# Patient Record
Sex: Female | Born: 1970 | Race: White | Hispanic: No
Health system: Southern US, Community
[De-identification: ages and names within clinical notes are randomized; demographics above are authoritative.]

## PROBLEM LIST (undated history)

## (undated) DIAGNOSIS — G43909 Migraine, unspecified, not intractable, without status migrainosus: Secondary | ICD-10-CM

## (undated) DIAGNOSIS — K219 Gastro-esophageal reflux disease without esophagitis: Secondary | ICD-10-CM

## (undated) DIAGNOSIS — F419 Anxiety disorder, unspecified: Secondary | ICD-10-CM

## (undated) DIAGNOSIS — K5090 Crohn's disease, unspecified, without complications: Secondary | ICD-10-CM

## (undated) HISTORY — DX: Gastro-esophageal reflux disease without esophagitis: K21.9

## (undated) HISTORY — PX: VAGINA SURGERY: SHX829

## (undated) HISTORY — PX: COLOSTOMY: SHX63

## (undated) HISTORY — DX: Crohn's disease, unspecified, without complications: K50.90

## (undated) HISTORY — PX: COLON SURGERY: SHX602

---

## 1997-10-29 ENCOUNTER — Other Ambulatory Visit: Admission: RE | Admit: 1997-10-29 | Discharge: 1997-10-29 | Payer: Self-pay | Admitting: Obstetrics and Gynecology

## 2001-09-07 ENCOUNTER — Emergency Department (HOSPITAL_COMMUNITY): Admission: EM | Admit: 2001-09-07 | Discharge: 2001-09-07 | Payer: Self-pay | Admitting: Emergency Medicine

## 2002-01-25 ENCOUNTER — Ambulatory Visit (HOSPITAL_COMMUNITY): Admission: RE | Admit: 2002-01-25 | Discharge: 2002-01-25 | Payer: Self-pay | Admitting: General Surgery

## 2002-01-25 ENCOUNTER — Encounter (INDEPENDENT_AMBULATORY_CARE_PROVIDER_SITE_OTHER): Payer: Self-pay | Admitting: Specialist

## 2002-02-17 ENCOUNTER — Encounter: Payer: Self-pay | Admitting: Emergency Medicine

## 2002-02-18 ENCOUNTER — Encounter: Payer: Self-pay | Admitting: Emergency Medicine

## 2002-02-18 ENCOUNTER — Inpatient Hospital Stay (HOSPITAL_COMMUNITY): Admission: EM | Admit: 2002-02-18 | Discharge: 2002-02-21 | Payer: Self-pay | Admitting: Emergency Medicine

## 2002-02-18 ENCOUNTER — Encounter: Payer: Self-pay | Admitting: Family Medicine

## 2002-02-20 ENCOUNTER — Encounter: Payer: Self-pay | Admitting: General Surgery

## 2002-02-21 ENCOUNTER — Encounter: Payer: Self-pay | Admitting: General Surgery

## 2004-01-23 ENCOUNTER — Ambulatory Visit (HOSPITAL_COMMUNITY): Admission: RE | Admit: 2004-01-23 | Discharge: 2004-01-23 | Payer: Self-pay | Admitting: General Surgery

## 2004-06-14 ENCOUNTER — Other Ambulatory Visit: Admission: RE | Admit: 2004-06-14 | Discharge: 2004-06-14 | Payer: Self-pay | Admitting: Obstetrics and Gynecology

## 2005-03-29 ENCOUNTER — Ambulatory Visit: Admission: RE | Admit: 2005-03-29 | Discharge: 2005-03-29 | Payer: Self-pay | Admitting: Gynecology

## 2005-04-15 ENCOUNTER — Ambulatory Visit (HOSPITAL_COMMUNITY): Admission: RE | Admit: 2005-04-15 | Discharge: 2005-04-15 | Payer: Self-pay | Admitting: Gynecology

## 2005-05-24 ENCOUNTER — Ambulatory Visit (HOSPITAL_COMMUNITY): Admission: RE | Admit: 2005-05-24 | Discharge: 2005-05-24 | Payer: Self-pay | Admitting: Gynecology

## 2005-05-24 ENCOUNTER — Encounter (INDEPENDENT_AMBULATORY_CARE_PROVIDER_SITE_OTHER): Payer: Self-pay | Admitting: Specialist

## 2005-05-29 ENCOUNTER — Inpatient Hospital Stay (HOSPITAL_COMMUNITY): Admission: AD | Admit: 2005-05-29 | Discharge: 2005-05-29 | Payer: Self-pay | Admitting: Obstetrics and Gynecology

## 2005-06-03 ENCOUNTER — Ambulatory Visit: Admission: RE | Admit: 2005-06-03 | Discharge: 2005-06-03 | Payer: Self-pay | Admitting: Gynecology

## 2005-06-16 ENCOUNTER — Inpatient Hospital Stay (HOSPITAL_COMMUNITY): Admission: AD | Admit: 2005-06-16 | Discharge: 2005-06-16 | Payer: Self-pay | Admitting: Obstetrics and Gynecology

## 2005-06-22 ENCOUNTER — Ambulatory Visit: Admission: RE | Admit: 2005-06-22 | Discharge: 2005-06-22 | Payer: Self-pay | Admitting: Gynecology

## 2005-07-13 ENCOUNTER — Ambulatory Visit: Admission: RE | Admit: 2005-07-13 | Discharge: 2005-07-13 | Payer: Self-pay | Admitting: Gynecology

## 2005-08-02 ENCOUNTER — Ambulatory Visit: Admission: RE | Admit: 2005-08-02 | Discharge: 2005-08-02 | Payer: Self-pay | Admitting: Gynecology

## 2005-08-30 ENCOUNTER — Ambulatory Visit: Admission: RE | Admit: 2005-08-30 | Discharge: 2005-08-30 | Payer: Self-pay | Admitting: Gynecology

## 2005-08-31 ENCOUNTER — Encounter: Admission: RE | Admit: 2005-08-31 | Discharge: 2005-08-31 | Payer: Self-pay | Admitting: Gastroenterology

## 2005-09-08 ENCOUNTER — Encounter: Admission: RE | Admit: 2005-09-08 | Discharge: 2005-09-08 | Payer: Self-pay | Admitting: Gastroenterology

## 2005-09-13 ENCOUNTER — Encounter: Admission: RE | Admit: 2005-09-13 | Discharge: 2005-09-13 | Payer: Self-pay | Admitting: Gastroenterology

## 2005-09-30 ENCOUNTER — Ambulatory Visit: Admission: RE | Admit: 2005-09-30 | Discharge: 2005-09-30 | Payer: Self-pay | Admitting: Gynecology

## 2005-12-28 ENCOUNTER — Ambulatory Visit: Admission: RE | Admit: 2005-12-28 | Discharge: 2005-12-28 | Payer: Self-pay | Admitting: Gynecology

## 2006-02-07 ENCOUNTER — Ambulatory Visit: Admission: RE | Admit: 2006-02-07 | Discharge: 2006-02-07 | Payer: Self-pay | Admitting: Gynecology

## 2006-03-28 ENCOUNTER — Encounter: Admission: RE | Admit: 2006-03-28 | Discharge: 2006-03-28 | Payer: Self-pay | Admitting: Gastroenterology

## 2006-09-01 ENCOUNTER — Ambulatory Visit: Admission: RE | Admit: 2006-09-01 | Discharge: 2006-09-01 | Payer: Self-pay | Admitting: Gynecology

## 2007-07-16 ENCOUNTER — Ambulatory Visit (HOSPITAL_COMMUNITY): Admission: RE | Admit: 2007-07-16 | Discharge: 2007-07-16 | Payer: Self-pay | Admitting: Otolaryngology

## 2008-12-17 ENCOUNTER — Ambulatory Visit: Admission: RE | Admit: 2008-12-17 | Discharge: 2008-12-17 | Payer: Self-pay | Admitting: Gynecology

## 2009-01-30 ENCOUNTER — Ambulatory Visit: Admission: RE | Admit: 2009-01-30 | Discharge: 2009-01-30 | Payer: Self-pay | Admitting: Gynecology

## 2009-10-20 ENCOUNTER — Encounter: Admission: RE | Admit: 2009-10-20 | Discharge: 2009-10-20 | Payer: Self-pay | Admitting: Gastroenterology

## 2010-07-25 ENCOUNTER — Encounter: Payer: Self-pay | Admitting: Gastroenterology

## 2010-08-05 ENCOUNTER — Other Ambulatory Visit: Payer: Self-pay | Admitting: Neurology

## 2010-08-05 DIAGNOSIS — R292 Abnormal reflex: Secondary | ICD-10-CM

## 2010-08-05 DIAGNOSIS — R519 Headache, unspecified: Secondary | ICD-10-CM

## 2010-08-05 DIAGNOSIS — R200 Anesthesia of skin: Secondary | ICD-10-CM

## 2010-08-11 ENCOUNTER — Ambulatory Visit
Admission: RE | Admit: 2010-08-11 | Discharge: 2010-08-11 | Disposition: A | Discharge status: Home / Self Care | Payer: 59 | Source: Ambulatory Visit | Attending: Neurology | Admitting: Neurology

## 2010-08-11 DIAGNOSIS — R200 Anesthesia of skin: Secondary | ICD-10-CM

## 2010-08-11 DIAGNOSIS — R292 Abnormal reflex: Secondary | ICD-10-CM

## 2010-08-11 DIAGNOSIS — R519 Headache, unspecified: Secondary | ICD-10-CM

## 2010-09-08 ENCOUNTER — Other Ambulatory Visit: Payer: Self-pay | Admitting: Gastroenterology

## 2010-09-08 ENCOUNTER — Ambulatory Visit
Admission: RE | Admit: 2010-09-08 | Discharge: 2010-09-08 | Disposition: A | Discharge status: Home / Self Care | Payer: 59 | Source: Ambulatory Visit | Attending: Gastroenterology | Admitting: Gastroenterology

## 2010-09-08 DIAGNOSIS — R509 Fever, unspecified: Secondary | ICD-10-CM

## 2010-09-08 DIAGNOSIS — R059 Cough, unspecified: Secondary | ICD-10-CM

## 2010-09-08 DIAGNOSIS — R05 Cough: Secondary | ICD-10-CM

## 2010-09-09 ENCOUNTER — Other Ambulatory Visit: Payer: Self-pay | Admitting: Gastroenterology

## 2010-09-16 ENCOUNTER — Ambulatory Visit
Admission: RE | Admit: 2010-09-16 | Discharge: 2010-09-16 | Disposition: A | Discharge status: Home / Self Care | Payer: 59 | Source: Ambulatory Visit | Attending: Gastroenterology | Admitting: Gastroenterology

## 2010-09-16 ENCOUNTER — Other Ambulatory Visit: Payer: 59

## 2010-09-16 ENCOUNTER — Other Ambulatory Visit: Payer: Self-pay | Admitting: Gastroenterology

## 2010-10-19 ENCOUNTER — Ambulatory Visit (HOSPITAL_COMMUNITY)
Admission: RE | Admit: 2010-10-19 | Discharge: 2010-10-19 | Disposition: A | Discharge status: Home / Self Care | Payer: 59 | Source: Ambulatory Visit | Attending: Gastroenterology | Admitting: Gastroenterology

## 2010-10-19 DIAGNOSIS — R109 Unspecified abdominal pain: Secondary | ICD-10-CM | POA: Insufficient documentation

## 2010-10-19 DIAGNOSIS — K5090 Crohn's disease, unspecified, without complications: Secondary | ICD-10-CM | POA: Insufficient documentation

## 2010-10-19 DIAGNOSIS — K449 Diaphragmatic hernia without obstruction or gangrene: Secondary | ICD-10-CM | POA: Insufficient documentation

## 2010-10-19 DIAGNOSIS — K219 Gastro-esophageal reflux disease without esophagitis: Secondary | ICD-10-CM | POA: Insufficient documentation

## 2010-10-24 NOTE — Op Note (Signed)
  NAME:  Stephanie, [name]               ACCOUNT NO.:  [id number]  MEDICAL RECORD NO.:  [id number]           PATIENT TYPE:  O  LOCATION:  WLEN                         FACILITY:  [facility]  PHYSICIAN:  Petra Kuba, M.D.    DATE OF BIRTH:  March 30, 1971  DATE OF PROCEDURE:  10/19/2010 DATE OF DISCHARGE:                              OPERATIVE REPORT   PROCEDURE:  Ileoscopy.  INDICATION FOR PROCEDURE:  The patient with history of Crohn's disease with multiple medical symptoms including abdominal pain and wants to rule out active Crohn's.  Consent was signed after the risks, benefits, methods, and options were thoroughly discussed multiple times in the past.  ADDITIONAL MEDICINES FOR THIS PROCEDURE:  80 mg of propofol only.  PROCEDURE IN DETAIL:  The endoscope was inserted via the ostomy and in the two man procedure with the nurse assisting, we were able to advance to 40 cm where unfortunately a tight turn was seen at that juncture and we could not maneuver the upper endoscope around the that curve.  On insertion, no signs of Crohn's or abnormalities were seen.  The scope was slowly withdrawn again, confirming a normal terminal ileum.  The scope was removed.  The patient tolerated the procedure well.  There was no obvious immediate complication.  ENDOSCOPIC DIAGNOSIS:  Normal ileoscopy to 40 centimeters.  PLAN:  We will see her back in a few weeks to decide any further workup or decide whether we should try either prednisone or Humira with all of the risks being discussed multiple times.  She will call me sooner p.r.n.          ______________________________ Petra Kuba, M.D.     MEM/MEDQ  D:  10/19/2010  T:  10/19/2010  Job:  [id number]  cc:   Petra Kuba, M.D. Fax: [phone]  Electronically Signed by Vida Rigger M.D. on 10/24/2010 11:22:00 AM

## 2010-10-24 NOTE — Op Note (Signed)
  NAME:  Stephanie [name], Stephanie [name]               ACCOUNT NO.:  [id number]  MEDICAL RECORD NO.:  [id number]           PATIENT TYPE:  O  LOCATION:  WLEN                         FACILITY:  [facility]  PHYSICIAN:  Petra Kuba, M.D.    DATE OF BIRTH:  09-Jan-1971  DATE OF PROCEDURE:  10/19/2010 DATE OF DISCHARGE:                              OPERATIVE REPORT   PROCEDURE:  Esophagogastroduodenoscopy.  INDICATION:  Abdominal pain, upper tract symptoms.  Consent was signed after risks, benefits, methods, options thoroughly discussed multiple times in the past.  MEDICINES USED PER ANESTHESIA: 1. Propofol 180 mg. 2. Versed 2.5 mg. 3. Fentanyl 50 mcg.  PROCEDURE DETAILS:  The video endoscope was inserted by direct vision. Quick look at the vocal cords were normal.  The esophagus was normal. She did have a tiny to small hiatal hernia.  Scope passed into the stomach, advanced through a normal antrum, normal pylorus into a normal duodenal bulb and around the C-loop to a normal second portion of the duodenum.  Unfortunately due to looping, we could not advance any further, so the scope was slowly withdrawn back to the bulb and again a good look was normal.  Scope was withdrawn back to stomach and retroflexed.  Angularis, cardia, fundus, lesser and greater curve were normal on retroflexed visualization.  Straight visualization of the stomach did not reveal any additional findings.  Air was suctioned, scope removed.  Again, the tiny small hiatal hernia was confirmed.  The esophagus was normal.  Scope was removed.  The patient tolerated the procedure well.  There was no obvious immediate complication.  ENDOSCOPIC DIAGNOSES: 1. Tiny to small hiatal hernia. 2. Otherwise, normal EGD.  PLAN:  Continue workup with a ileoscopy.  Continue pump inhibitors or acid blockers with further workup and plans pending findings below.          ______________________________ Petra Kuba, M.D.     MEM/MEDQ  D:   10/19/2010  T:  10/19/2010  Job:  [id number]  Electronically Signed by Vida Rigger M.D. on 10/24/2010 11:22:02 AM

## 2010-11-16 NOTE — Consult Note (Signed)
NAME:  Stephanie [name], Stephanie [name] NO.:  [id number]   MEDICAL RECORD NO.:  [id number]          PATIENT TYPE:  OUT   LOCATION:  GYN                          FACILITY:  [facility]   PHYSICIAN:  De Blanch, M.D.DATE OF BIRTH:  August 24, 1970   DATE OF CONSULTATION:  12/17/2008  DATE OF DISCHARGE:                                 CONSULTATION   CHIEF COMPLAINT:  Vaginal discharge.   HISTORY OF PRESENT ILLNESS:  The patient was last seen in February 2008.  Prior to that, she had undergone extensive resection of multiple sinus  tracts in the perineum for persistent Crohn's disease.  At that time,  she was doing well and had fully recovered.  However in more recent  months, she has developed increasing vaginal discharge requiring her to  wear a mini-pad.  She denies any bleeding.  She has a chronic low-grade  temperature of 101, but does not have any significant pain in the  perineum or vagina.  She remains under the care of Dr. Ewing Schlein with her  chronic Crohn's disease.  Currently, she is not taking any medication  treating the Crohn's disease.   PAST MEDICAL HISTORY/ILLNESSES:  Crohn's disease, undergoing multiple  surgical abdominal procedures, total colectomy and ileostomy.  We  excised multiple chronic fistulous tracts in November 2006.  The area  was left open to granulate and ultimately developed squamous epithelium  across the perineum and lower vagina.  The patient's pain is managed by  Dr. Thyra Breed.   SOCIAL HISTORY:  The patient is single.  She is currently unemployed.   FAMILY HISTORY:  Negative for gynecologic, breast or colon cancer.   OBSTETRICAL HISTORY:  Gravida 0.   GYNECOLOGIC HISTORY:  The patient has no abnormal Pap smears.  She has  regular cyclic menstrual periods and no intermenstrual spotting   REVIEW OF SYSTEMS:  A 10-point comprehensive review of systems negative  except as noted above.   PHYSICAL EXAMINATION:  VITAL SIGNS:  Weight 132  pounds.  GENERAL:  The patient is a healthy white female in no acute distress.  HEENT:  Negative.  NECK:  Supple without thyromegaly.  There is no supraclavicular or  inguinal adenopathy.  ABDOMEN:  Soft, nontender.  PELVIC:  EGBUS is essentially normal except for the more open perineum.  However inspection just beyond the perineum, reveals very thin squamous  epithelium and an area of velvety granulation tissue.  There is no  evidence of any fistulas, fissures or sinus tracts.  There is a band of  the posterior vagina about midway and then above this is the cervix  which appears normal.   IMPRESSION:  Chronic granulation tissue in the perineum.  This is the  area that granulated in over a period of time after her excision in  2006.  I believe the granulation tissue is the source of her discharge  as I do not feel any abscesses or sinus tracts.  Clearly this is a  difficult problem and replacing this skin would be difficult by  grafting.  I  would like to attempt to treat this with MetroGel  every other night  alternating with Premarin cream every other night.   FOLLOW UP:  We will plan on seeing the patient back again in 1 month for  reevaluation.      De Blanch, M.D.  Electronically Signed     DC/MEDQ  D:  12/17/2008  T:  12/17/2008  Job:  [id number]   cc:   Petra Kuba, M.D.  Fax: [phone]   Mark L. Vear Clock, M.D.  Fax: [phone]   Carrington Clamp, M.D.  Fax: [phone]   Milagros Evener, M.D.  Fax: [phone]   W. Danella Maiers, M.D.  Fax: [phone]   Telford Nab, R.N.  [address]  [place], [place] [address]

## 2010-11-16 NOTE — Consult Note (Signed)
NAME:  Stephanie [name], Stephanie [name]               ACCOUNT NO.:  [id number]   MEDICAL RECORD NO.:  [id number]          PATIENT TYPE:  OUT   LOCATION:  GYN                          [facility]ACILITY:  [facility]   PHYSICIAN:  De Blanch, M.D.DATE OF BIRTH:  December 26, 1970   DATE OF CONSULTATION:  01/30/2009  DATE OF DISCHARGE:                                 CONSULTATION   The patient returns today for reevaluation having been given a regimen  to assist with her vaginal discharge.  She is using MetroGel alternating  with Premarin cream every other night.  She reports that overall during  the past month, she has somewhat improved.  She does not wish to be  examined today.   She is concerned about what appears to be a 15 pound weight gain over  the past month and a half.  She has no explanation for this weight gain.  We have weighed her on several scales and they are consistent in  documenting that her current weight is 147 pounds.   IMPRESSION:  Chronic vaginitis.  We will continue the regimen outlined  above and the patient will call us in 1 month to bring [place] up to date  with how she is doing.  I will plan on seeing the patient again in  approximately 2 months.   Face-to-face consultation time today was approximately 10 minutes.      De Blanch, M.D.  Electronically Signed     DC/MEDQ  D:  01/30/2009  T:  01/30/2009  Job:  [id number]   cc:   Telford Nab, R.N.  [address]  [place], [place] [address]

## 2010-11-19 NOTE — Consult Note (Signed)
NAME:  Stephanie [name], Stephanie [name] NO.:  [id number]   MEDICAL RECORD NO.:  [id number]          PATIENT TYPE:  OUT   LOCATION:  GYN                          FACILITY:  [facility]   PHYSICIAN:  De Blanch, M.D.DATE OF BIRTH:  [birth year]   DATE OF CONSULTATION:  12/28/2005  DATE OF DISCHARGE:                                   CONSULTATION   CHIEF COMPLAINT:  Vaginal drainage.   HISTORY OF PRESENT ILLNESS:  The patient returns today for continuing  followup, having undergone extensive excision of her perineum for fistulae  associated with Crohn's disease.  Her recovery has been steady.  Today, she  presents, indicated she is doing quite well.  She is using her dilator less  often.  She does continue to have some drainage requiring a mini-pad.  She  is not have any bleeding.  She has not resumed having sexual intercourse.   Recently, she had been working with Dr. Ewing Schlein because of new onset of  nausea, vomiting and headaches.  She thinks is associated with a prior use  of Augmentin, which resulted in diarrhea.   Past medical history, current medications, social history, family history  and obstetrical history are reviewed and are unchanged from my note of  August 30, 2005.   REVIEW OF SYSTEMS:  Ten-point comprehensive review of systems is negative  except as noted above.   PHYSICAL EXAM:  VITAL SIGNS:  Weight 103 pounds, blood pressure 110/70,  pulse 60, respiratory rate 20.  GENERAL:  The patient is a healthy, slender white female in no acute  distress.  HEENT:  Negative.  NECK:  Supple without thyromegaly.  ABDOMEN:  Soft and nontender.  No mass, organomegaly, ascites or hernias are  noted.  Her stoma is healthy.  PELVIC:  EG/BUS are normal.  The posterior  perineum has epithelialized up to a very small, thin area of granulation  tissue which joins the vagina.  The introitus is patent, as is the vagina.  No other abnormalities are noted.   DESCRIPTION OF  PROCEDURE:  Silver nitrate is applied to the area of  granulation tissue along with some Hurricaine gel.  The patient tolerated  the procedure well.  I am hopeful that the application of silver nitrate  will result in resolution of the remaining granulation tissue.   FOLLOWUP:  The patient will return to see me in approximately 1 month for  reevaluation and further use of silver nitrate may be necessary at that  time.   ASSESSMENT:  Overall, the patient is quite satisfied with her results.      De Blanch, M.D.  Electronically Signed     DC/MEDQ  D:  12/28/2005  T:  12/28/2005  Job:  [id number]   cc:   Carrington Clamp, M.D.  Fax: [phone]   Petra Kuba, M.D.  Fax: [phone]   Telford Nab, R.N.  [address]  [place], [place] [address]

## 2010-11-19 NOTE — Op Note (Signed)
NAME:  [name], [name]                         ACCOUNT NO.:  [id number]   MEDICAL RECORD NO.:  [id number]                   PATIENT TYPE:  AMB   LOCATION:  DAY                                  FACILITY:  [facility]   PHYSICIAN:  Timothy E. Earlene Plater, M.D.              DATE OF BIRTH:  [birth year]   DATE OF PROCEDURE:  DATE OF DISCHARGE:                                 OPERATIVE REPORT   PREOPERATIVE DIAGNOSIS:  Presence of Port-A-Cath.   POSTOPERATIVE DIAGNOSIS:  Presence of Port-A-Cath.   PROCEDURE:  Removal of Port-A-Cath.   SURGEON:  Timothy E. Earlene Plater, M.D.   ANESTHESIA:  Local with standby.   Ms. Stephanie [name] is now 60.  Has multiple operations and complications for  Crohn's disease.  Is on chronic narcotics.  At [facility], a Port-A-Cath had been  installed two years ago but has not been accessed, flushed, or used in the  past year.  She has lost weight, and its presence is more prominent and more  uncomfortable.  She wants it removed.  Although she is a very difficult IV  access, she understands that removing this will remove her easy access.  She  is pleasant, informed, and has been identified and signed the permit.   She is taken to the operating room after difficult IV access peripherally.  Heavy sedation was given.  The left upper chest was prepped and draped in  the usual fashion.  Marcaine 0.25% was used for local anesthesia.  The scar  on the left upper chest was excised.  The tunnel of the Port-A-Cath was  dissected.  The catheter was grasped and removed from its tunnel to the left  subclavian area.  There was no bleeding or complication.  The port device  was then removed from its envelope.  Sutures cut and also removed.  Tiny  bleeding points were cauterized.  The wound was perfectly dry.  It was  closed in layers with 4-0 Monocryl.  Steri-Strips and Tegaderm applied.  She  tolerated it well without complications and was removed to the recovery room  in good condition.   Written and  verbal instructions given.  She will be seen and followed as  needed.                                               Timothy E. Earlene Plater, M.D.    TED/MEDQ  D:  01/23/2004  T:  01/23/2004  Job:  [id number]

## 2010-11-19 NOTE — Consult Note (Signed)
NAME:  Stephanie [name], SPROWL NO.:  [id number]   MEDICAL RECORD NO.:  [id number]          PATIENT TYPE:  OUT   LOCATION:  GYN                          FACILITY:  [facility]   PHYSICIAN:  De Blanch, M.D.DATE OF BIRTH:  [birth year]   DATE OF CONSULTATION:  09/30/2005  DATE OF DISCHARGE:  09/30/2005                                   CONSULTATION   A 40 year old white female returns for continuing followup of postoperative  care following radical resection of perineal vaginal fistulae associated  with Crohn's disease.   Since her last visit, the patient has continued to use a vaginal dilator  with good success.  She does still have some pain and tenderness in the  posterior vagina.  She is seeing Dr. Vida Rigger because of abdominal pain  and discomfort.  She has had a CT scan of the chest, x-ray which were  negative.  She reports it is felt that she probably has a panic disorder and  is scheduled to begin some psychological evaluation and counseling in the  near future.  She seems very positive about potential for improvement with  this counseling.  Her only gynecologic complaint is that of some burning and  itching of the vulva.   PHYSICAL EXAM:  Weight 105 pounds.  ABDOMEN:  Soft, nontender, no mass, organomegaly, ascites or hernia noted.  Her stoma is healthy.  PELVIC:  EG, BUS shows that there is excoriation on both labia majora.  Apparently the patient has been scratching these pruritic areas.  No gross  lesions are noted.  The remainder of the vulva continues to epithelialize  well.  There remains approximately a 1 cm area between the perineal closure  and the true vagina which is still granulating.  The vagina admits two  fingers easily.  The area of discomfort is the midline of the area that is  healing and still granulating.  The remainder of her vulva appears to have  healed nicely.  Bimanual exam reveals no significant tenderness except for  the area in  the midline posteriorly.   IMPRESSION:  The patient continues to heal well.  Will plan on seeing her  back in 4-6 weeks.  She will continue to use the dilator.  I think that if  she is not too uncomfortable, she could resume sexual intercourse in the  near future.      De Blanch, M.D.  Electronically Signed     DC/MEDQ  D:  09/30/2005  T:  10/03/2005  Job:  [id number]   cc:   Carrington Clamp, M.D.  Fax: [phone]   Telford Nab, R.N.  [address]  [place], [place] [address]   Petra Kuba, M.D.  Fax: [phone]

## 2010-11-19 NOTE — Consult Note (Signed)
NAME:  Stephanie [name], Stephanie [name]               ACCOUNT NO.:  [id number]   MEDICAL RECORD NO.:  [id number]          PATIENT TYPE:  AMB   LOCATION:  GYN                          FACILITY:  [facility]   PHYSICIAN:  De Blanch, M.D.DATE OF BIRTH:  Dec 12, 1970   DATE OF CONSULTATION:  DATE OF DISCHARGE:                                   CONSULTATION   CHIEF COMPLAINT:  Vaginal drainage.   INTERVAL HISTORY:  Since her last visit, the patient has done reasonably  well; however, over the past week, she has noted an increased amount of  vaginal drainage.  She also had some cramping and may have had a menstrual  period.  She did have bleeding, but it was a little shorter than her usual.  It is noted that her menstrual periods are fairly irregular.   Her pain medications have been adjusted.  She is currently using 8 mg of  Dilaudid every 6 hours as well as a Duragesic patch.  With the increased  vaginal discharge, she saw Dr. Henderson Cloud, who gave her a prescription for  seven days of Levaquin.  The patient denies any fevers or chills.  Says that  really, the discharge has not changed since completing the Levaquin.   HISTORY OF PRESENT ILLNESS:  The patient has a longstanding history of  Crohn's disease.  She has had multiple surgical procedures.  Ultimately, she  has had a total colectomy, including abdominal perineal resection.  She  initially was seen in September, 2006 with chronic, painful, perineal  fistula.  These were resected on May 24, 2005 and left open to  granulate and close by secondary intention.   OBSTETRICAL HISTORY:  Gravida 0.   CURRENT MEDICATIONS:  As noted above.   REVIEW OF SYSTEMS:  A 10-point comprehensive review of systems is performed  and is negative, except as noted above.   SOCIAL HISTORY:  The patient is single.  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   FAMILY HISTORY:  Negative for gynecologic, breast, or colon cancer.   PHYSICAL EXAMINATION:  ABDOMEN:   Soft and nontender.  Her ileostomy is  healthy.  PELVIC:  EG/BUS is normal.  The open perineal and lower vulvar wound is  granulating beautifully, and there is some epithelialization going on at the  margins.  There is no evidence of infection, erythema, point tenderness, or  abscess formation.  The distal vagina appears normal as well.  EXTREMITIES:  Lower extremities are without edema and varicosities.   IMPRESSION:  Patient continues to heal well.  She is to continue her current  regimen.  She is given renewed prescription for Silvadene cream.  She will  continue to pack the area loosely, irrigate, and do sitz baths, as  previously prescribed.  She will return to see me on January 9th for  continuing followup.   ADDENDUM:  In order to regulate the patient's menstrual periods, she is  given a prescription for Ortho Tri-Cyclen.      De Blanch, M.D.  Electronically Signed     DC/MEDQ  D:  06/22/2005  T:  06/23/2005  Job:  [id number]   cc:   Carrington Clamp, M.D.  Fax: [phone]   Telford Nab, R.N.  [address]  [place], [place] [address]

## 2010-11-19 NOTE — Consult Note (Signed)
NAME:  Stephanie [name], Stephanie [name]               ACCOUNT NO.:  [id number]   MEDICAL RECORD NO.:  [id number]          PATIENT TYPE:  OUT   LOCATION:  GYN                          [facility]ACILITY:  [facility]   PHYSICIAN:  De Blanch, M.D.DATE OF BIRTH:  1970-10-09   DATE OF CONSULTATION:  08/30/2005  DATE OF DISCHARGE:  08/30/2005                                   CONSULTATION   CHIEF COMPLAINT:  Vaginal drainage.   INTERVAL HISTORY:  Since her last visit, the patient has continued to do  quite well. She is using the dilator to maintain vaginal patency and has  been irrigating the vulva. Her vaginal discharge is actually quite minimal  and she really has no significant bleeding or spotting. Overall she feels  better than she has over the last several months. She has recently seen Dr.  Ewing Schlein with regard to management of her abdominal symptoms which we believe  are likely associated with her long standing Crohn's disease.   PAST MEDICAL HISTORY:  Medical illnesses - long standing Crohn's disease  undergoing multiple surgical procedures including total colectomy.   In November 2006, she underwent wide resection of a number of perineal  vaginal fistulae. The wound was left open and has been granulating well and  reepithelializing. More recently the patient was given a vaginal dilator in  order to maintain vaginal patency.   CURRENT MEDICATIONS:  Duragesic patch.   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   FAMILY HISTORY:  Negative for gynecologic breast or colon cancer.   OBSTETRICAL HISTORY:  Gravida 0.   REVIEW OF SYSTEMS:  A 10-point comprehensive review of systems is negative  except as noted above.   PHYSICAL EXAMINATION:  VITAL SIGNS:  Weight 107 pounds, blood pressure  110/70, pulse 80, respiratory rate 20.  GENERAL:  The patient is a healthy, slender, white female in no acute  distress.  HEENT:  Negative.  NECK:  Supple without  thyromegaly. There was no supraclavicular or inguinal  adenopathy.  ABDOMEN:  Soft, nontender, no mass, organomegaly, ascites or hernias are  noted. Her stoma appears healthy and there is no evidence of herniation.  PELVIC:  EGBUS shows the perineal resection continues to heal wall and is  nearly completely epithelized. She has good vaginal patency easily admitting  a speculum. There is no evidence of fistula formation.   IMPRESSION:  Status post wide resection of perineum in order to resect  perineal vaginal fistula. She is having an excellent recovery. She will  continue to use a vaginal dilator, she was given a prescription for Premarin  cream in hopes of getting better epithelialization in the vagina as well as  more pliability. She will return to see me in 1 month for continuing  followup.      De Blanch, M.D.  Electronically Signed     DC/MEDQ  D:  09/12/2005  T:  09/13/2005  Job:  [id number]   cc:   Carrington Clamp, M.D.  Fax: [phone]   Petra Kuba, M.D.  Fax: [phone]   Telford Nab,  R.N.  [address]  [place], [place] [address]

## 2010-11-19 NOTE — Consult Note (Signed)
NAME:  Stephanie [name], Stephanie [name] NO.:  [id number]   MEDICAL RECORD NO.:  [id number]          PATIENT TYPE:  OUT   LOCATION:  GYN                          FACILITY:  [facility]   PHYSICIAN:  De Blanch, M.D.DATE OF BIRTH:  12-20-1970   DATE OF CONSULTATION:  09/01/2006  DATE OF DISCHARGE:                                 CONSULTATION   CHIEF COMPLAINT:  Pelvic discomfort, abnormal bleeding.   INTERVAL HISTORY:  The patient is here today for continued followup of  her perineal resection for persistent Crohn's disease.  She has been  able to resume sexual intercourse, has not had much difficulty with that  in recent months.  She is having some abnormal bleeding and has seen Dr.  Henderson Cloud.  She had been placed on Yaz and instructed to use it  continuously.  She has number of other symptoms associated with the flu  and Crohn's disease, but with regard to her pelvic symptoms she seems to  be doing quite well.   PAST MEDICAL HISTORY:  Longstanding history of Crohn's disease,  undergoing multiple surgical procedures.  She has had a total colectomy  and ileostomy and is doing well with that.  She is under the care of Dr.  Vida Rigger with regard to her Crohn's disease at the present time.   CURRENT MEDICATIONS:  Duragesic patch, Yaz.   SOCIAL HISTORY:  The patient is single.  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  She also sees a psychiatrist on a weekly basis.   FAMILY HISTORY:  Negative for gynecologic, breast and colon cancer.   OBSTETRICAL HISTORY:  Gravida 0.   GYNECOLOGIC HISTORY:  The patient denies any abnormal Pap smears.  Please see above regarding her current gynecologic symptoms.   REVIEW OF SYSTEMS:  Ten-point comprehensive review of systems negative  except for those symptoms noted above.   HISTORY AND PHYSICAL EXAMINATION:  VITAL SIGNS:  Weight 106 pounds,  blood pressure 105/76.  GENERAL:  The patient is a healthy white female who seems  somewhat  emotionally distressed.  HEENT:  Negative.  NECK:  Supple without thyromegaly.  There is no supraclavicular or  inguinal adenopathy.  ABDOMEN:  Soft and nontender.  Her ileostomy appears healthy.  PELVIC:  EG/BUS shows that the area where we resected an extensive  portion of the perineum has completely healed, although the skin is  relatively thin across this area.  Vagina is otherwise normal as is the  cervix.  Uterus is anterior; normal shape, size, and consistency.  No  adnexal masses noted.   IMPRESSION:  Status post resection of the perineum in order to excise  multiple sinus tracts and abscess from her chronic Crohn's disease.  She  has healed very nicely at the present time and is able has sexual  intercourse.  Her introitus is not stenotic and the vagina appears  healthy.  There are some atrophic changes in the lower introitus.  I  would suggest she use Premarin cream to this area to see if we can  obtain better epithelialization.   At this juncture I believe  we have achieved maximum benefit for Ms.  [name] with regard to her perineal fistulae and abscesses, and  therefore we will release her back to the care of her gynecologist, Dr.  Carrington Clamp, and we appreciate Dr. Kittie Plater referral in  consultation.      De Blanch, M.D.  Electronically Signed     DC/MEDQ  D:  09/01/2006  T:  09/01/2006  Job:  [id number]   cc:   Carrington Clamp, M.D.  Fax: [phone]   Petra Kuba, M.D.  Fax: [phone]   Telford Nab, R.N.  [address]  [place], [place] [address]

## 2010-11-19 NOTE — Consult Note (Signed)
NAME:  Stephanie [name], Stephanie [name] NO.:  [id number]   MEDICAL RECORD NO.:  [id number]          PATIENT TYPE:  OUT   LOCATION:  GYN                          FACILITY:  [facility]   PHYSICIAN:  De Blanch, M.D.DATE OF BIRTH:  30-Oct-1970   DATE OF CONSULTATION:  DATE OF DISCHARGE:                                   CONSULTATION   CHIEF COMPLAINT:  Crohn's disease.   INTERVAL HISTORY:  Patient returns today for continuing followup and  recovery after having extensive resection of perineal fistula.  She reports  that from a pelvic point of view, she is doing well.  She denies any vaginal  bleeding.  She does have some persistent vaginal drainage, but this is only  minimal to modest, as compared to in the past when her fistulae were active.  At her last visit, we placed some silver nitrate on some granulation tissue.  The patient reports that caused severe pain.   With regard to vaginal function, the patient has not resumed having vaginal  intercourse.  She is otherwise having some difficulty with apparently a  flare of her Crohn's' disease, causing cutaneous and oral lesions.  In  addition, she has seen a counselor regarding psychological and emotional  issues that she has.   Patient reports that her husband is also in counseling because of anxiety  disorder.   PAST MEDICAL HISTORY:  Longstanding Crohn's disease, undergoing multiple  surgical procedures.  Patient has had a total colectomy and an ileostomy.   CURRENT MEDICATIONS:  Duragesic patch.   SOCIAL HISTORY:  The patient is single.  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   FAMILY HISTORY:  Negative for gynecologic, breast, or colon cancer.   OBSTETRICAL HISTORY:  Gravida 0.   GYNECOLOGIC HISTORY:  The patient denies any abnormal Pap smears.  She has  somewhat irregular periods.   REVIEW OF SYSTEMS:  A 10-point comprehensive review of systems is negative,  except as noted above.   PHYSICAL  EXAMINATION:  VITAL SIGNS:  Weight 103 pounds.  Blood pressure  100/68, pulse 70, respiratory rate 20.  GENERAL:  The patient is a healthy slender white female in no acute  distress.  HEENT:  Negative.  NECK:  Supple without thyromegaly.  There is no supraclavicular or inguinal  adenopathy.  ABDOMEN:  Soft and nontender.  No mass, organomegaly, ascites, or hernias  are noted.  Her ileostomy stoma appears healthy.  PELVIC:  EG/BUS shows that the area of the perineum which was resected has  completely healed and granulated.  She does have a dimple at the junction of  the resection and her mid posterior vagina.  There is also a ridge at this  juncture at the posterior aspect of the vaginal opening.  Anteriorly, the  vagina is normal.  The vagina is deviated posteriorly, as is the cervix.  The uterus is anterior but posterior in the pelvis.  The vagina does admit  two fingers easily.   IMPRESSION:  Status post radical resection of perineal fistula, now with  very good healing.  We had a lengthy  discussion regarding resuming sexual  intercourse.  She indicates that she and her husband are somewhat timid and  hesitant to take this step, and I suggested slowly resuming sexual  intercourse and attempting change of position as well as using digital  examination to becoming more familiar with her genital area.  She indicates  she would attempt this with her husband.  She will return to see me in three  months for continued followup.      De Blanch, M.D.  Electronically Signed     DC/MEDQ  D:  02/07/2006  T:  02/07/2006  Job:  [id number]   cc:   Petra Kuba, M.D.  Fax: [phone]   Mark L. Vear Clock, M.D.  Fax: [phone]   Milagros Evener, M.D.  Fax: [phone]   Telford Nab, R.N.  [address]  [place], [place] [address]

## 2010-11-19 NOTE — Op Note (Signed)
NAME:  ANORA, SCHWENKE               ACCOUNT NO.:  [id number]   MEDICAL RECORD NO.:  [id number]          PATIENT TYPE:  AMB   LOCATION:  DAY                          FACILITY:  [facility]   PHYSICIAN:  De Blanch, M.D.DATE OF BIRTH:  Nov 02, 1970   DATE OF PROCEDURE:  DATE OF DISCHARGE:                                 OPERATIVE REPORT   PREOPERATIVE DIAGNOSES:  1.  Crohn's disease with status post abdominal perineal resection.  2.  Chronic perineal vaginal fistulae.   POSTOPERATIVE DIAGNOSES:  1.  Crohn's disease with status post abdominal perineal resection.  2.  Chronic perineal vaginal fistulae.   PROCEDURE:  Excision of multiple perineal vaginal fistulae, debridement of  perineum and lower vagina.   SURGEON:  Dr. De Blanch   ASSISTANT:  Carrington Clamp, MD  Telford Nab, RN   ANESTHESIA:  General orotracheal tube.   ESTIMATED BLOOD LOSS:  5 mL.   COMPLICATIONS:  None.   SURGICAL FINDINGS:  Examination under anesthesia revealed perineum which  previously had had the anus removed.  There was chronic granulation in the  area where the anus previously existed, and probing this area identified 3  separate fistulae which extended between the anal dimple and the vagina.  One extended at least halfway up the posterior vaginal wall.  The perineal  body itself was extensively scarred.  Cervix and uterus are normal, and the  remainder of the vagina was normal.   PROCEDURE:  The patient is brought to the operating room and after  satisfactory attainment of general anesthesia, was placed in lithotomy  position in candy cane stirrups.  The vulva, perineum, and vagina were  prepped with Betadine, and the patient was draped.  The patient was examined  with the above-noted findings.  Using a wire probe, the perineal body was  probed and 3 separate fistulae were identified.  These were all in the  midline.  With the probe in place, an incision was made across the  perineum  in the midline down to the probe, opening up a large area of granulation  tissue within the fistula tract.  This was then excised using  electrocautery, excising a considerable amount of scarring in the perineum,  extending laterally into the ischial rectal fossa.  The third fistula was  then identified which extended farther posteriorly.  This was likewise  divided in the midline and then excised using cautery.  Additional  hemostasis achieved with cautery.  The area was irrigated, and no other  fistulae or sinus tracts  were identified.  The surgical field was then reirrigated and Silvadene  cream applied to the open wound.  This was packed loosely with Kerlix gauze.  The patient was awakened from anesthesia and taken to the recovery room in  satisfactory condition.  Sponge, needle, and instrument counts correct x2.      De Blanch, M.D.  Electronically Signed     DC/MEDQ  D:  05/24/2005  T:  05/24/2005  Job:  [id number]   cc:   Carrington Clamp, M.D.  Fax: [phone]   Telford Nab, R.N.  [address]  [place], [place] [address]

## 2010-11-19 NOTE — Op Note (Signed)
NAME:  Stephanie [name], Stephanie [name]               ACCOUNT NO.:  [id number]   MEDICAL RECORD NO.:  [id number]          PATIENT TYPE:  AMB   LOCATION:  DAY                          FACILITY:  [facility]   PHYSICIAN:  De Blanch, M.D.DATE OF BIRTH:  09-Jul-1970   DATE OF PROCEDURE:  04/15/2005  DATE OF DISCHARGE:                                 OPERATIVE REPORT   PREOPERATIVE DIAGNOSES:  Crohn's disease status post total colectomy and  proctectomy. Vulvar pain and drainage.   POSTOPERATIVE DIAGNOSIS:  Two perineal to vaginal fistulae and sinus tract.   PROCEDURE:  Examination under anesthesia.   SURGEON:  De Blanch, M.D.   ANESTHESIA:  General with orotracheal tube.   ESTIMATED BLOOD LOSS:  None.   SURGICAL FINDINGS:  Examination under anesthesia revealed a dimple where the  patient's anus had previously been. In this dimple was granulation tissue.  As we probed the dimple, two fistula were identified which extended from the  perineum through the vagina. The lower one extended in the vagina near the  introitus. The second one tracked to approximately 4 cm up the posterior  vagina. Just beneath this tract was yet another sinus tract which extended  approximately 3 cm from the skin level but did not seem to penetrate the  vagina. There was a band across the posterior vagina in the region of these  fistulae which was thickened most likely secondary to scarring from prior  surgery. The cervix was nulliparous and the remainder of the patient's exam  was normal. She did have some chronic irritation of both labia minora  unrelated to the fistula in my opinion.   Inspection of the patient's umbilicus revealed a small crack in the skin.  Apparently this been more inflamed several days ago but she been treating it  conservatively.   DESCRIPTION OF PROCEDURE:  The patient was brought to the operating room and  after satisfactory attainment of general anesthesia was placed in a modified  lithotomy position in Brooks stirrups. The perineum and vagina were prepped  with Betadine, the patient was draped. The patient was examined under  anesthesia with the above-noted  findings, wire probes were used to identify the fistula tracts. The  patient's abdomen was also inspected as noted above. The surgical procedure  was terminated and the patient was awakened from anesthesia and taken to the  recovery room in satisfactory condition. Sponge, needle and instrument  counts correct x2.      De Blanch, M.D.  Electronically Signed     DC/MEDQ  D:  04/15/2005  T:  04/15/2005  Job:  [id number]   cc:   Carrington Clamp, M.D.  Fax: [phone]   Telford Nab, R.N.  [address]  [place], [place] [address]

## 2010-11-19 NOTE — Consult Note (Signed)
NAME:  Stephanie [name], Stephanie [name]               ACCOUNT NO.:  [id number]   MEDICAL RECORD NO.:  [id number]          PATIENT TYPE:  OUT   LOCATION:  GYN                          FACILITY:  [facility]   PHYSICIAN:  De Blanch, M.D.DATE OF BIRTH:  10/27/1970   DATE OF CONSULTATION:  DATE OF DISCHARGE:                                   CONSULTATION   CHIEF COMPLAINT:  Perineal vaginal fistula, postoperative check.   Patient underwent resection of perineal vaginal fistula on May 24, 2005.  This is a relatively large resection, leaving the entire perineum and  perirectal spaces open to granulate.  The patient's initial postoperative  course was minimally complicated by some bleeding, which resolved  spontaneously.  She has had increased pain and had difficulty with pain  control.  She is chronically on a Duragesic patch.  She has seen Dr.  Carrington Clamp and apparently had a reaction to Stadol.  She has also been  restricting her fluids because voiding causes some discomfort, and she  presented to the emergency room dehydrated recently.   PHYSICAL EXAMINATION:  VITAL SIGNS:  Weight 110 pounds (stable).  ABDOMEN:  Soft and nontender.  PELVIC:  EG/BUS shows the area where the fistula was excised, is granulating  quite well.  There is no evidence of infection.  Palpation of this area  really does not elicit much pain.   IMPRESSION:  Status post resection of perineal vaginal fistula with good  granulation tissue, now less than two weeks postop.  The patient will  continue her irrigation, sitz baths, Silvadene cream, and packing.  We will  have her return to see me shortly before Christmas for further evaluation.      De Blanch, M.D.  Electronically Signed     DC/MEDQ  D:  06/03/2005  T:  06/03/2005  Job:  [id number]   cc:   Carrington Clamp, M.D.  Fax: [phone]   Telford Nab, R.N.  [address]  [place], [place] [address]

## 2010-11-19 NOTE — Consult Note (Signed)
NAME:  Stephanie [name], Stephanie [name] NO.:  [id number]   MEDICAL RECORD NO.:  [id number]          PATIENT TYPE:  OUT   LOCATION:  GYN                          FACILITY:  [facility]   PHYSICIAN:  De Blanch, M.D.DATE OF BIRTH:  [birth year]   DATE OF CONSULTATION:  DATE OF DISCHARGE:                                   CONSULTATION   CHIEF COMPLAINT:  Postoperative follow-up, fever with nausea.   INTERVAL HISTORY:  Overall the patient has been doing well with regard to  management of her perineum where the fistulae were resected. She does note  that over the year she has had a considerable amount of abdominal discomfort  with nausea and some vomiting. Her weight fell to 104 but she has gained  back to approximately 108 pounds. She denies any new abdominal pain and has  no apparent source for the fever which has been running between 99 and  101.1. Overall she has felt somewhat fatigued. She has not had any further  vaginal bleeding and the vaginal discharge is essentially absent.   HISTORY OF PRESENT ILLNESS:  The patient has a long-standing history of  Crohn's disease having undergone multiple surgical procedures including a  total colectomy. She underwent resection of perineal vaginal fistulae in  November of 2001. This area was left open to granulate.   OBSTETRICAL HISTORY:  Gravida 0.   CURRENT MEDICATIONS:  Duragesic patch.   SOCIAL HISTORY:  The patient is single. 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   FAMILY HISTORY:  Is negative for gynecologic, breast or colon cancer.   REVIEW OF SYSTEMS:  A 10-point comprehensive review of systems is negative  except for symptoms noted above.   PHYSICAL EXAMINATION:  VITAL SIGNS:  Weight 107-1/2 pound. Blood pressure  110/70, pulse 80.  GENERAL:  The patient is a healthy, slender white female in no acute  distress.  HEENT:  Is negative.  NECK:  Supple without thyromegaly. There is no supraclavicular or inguinal  adenopathy.  ABDOMEN:  Is soft, nontender. No mass, organomegaly, ascites or hernias are  noted. PELVIC EXAM: EG, BUS is normal. The perineal area is granulating  beautifully, it has a considerable amount of squamous epithelialization  especially from the outside moving inward. No evidence of infection.   IMPRESSION:  1.  Excellent continued healing following radical resection of perineal      vaginal fistulae. She will continue her      current regimen of management.  2.  Fever of uncertain etiology. This may well be a viral infection,      however, will obtain a CBC with differential for further evaluation. The      patient will return to see me in approximately 1 month for continuing      follow-up.      De Blanch, M.D.  Electronically Signed     DC/MEDQ  D:  07/13/2005  T:  07/14/2005  Job:  [id number]   cc:   Carrington Clamp, M.D.  Fax: [phone]   Telford Nab, R.N.  [address]  [place], [place] [address]

## 2010-11-19 NOTE — Consult Note (Signed)
NAME:  Stephanie [name], Stephanie [name] NO.:  [id number]   MEDICAL RECORD NO.:  [id number]          PATIENT TYPE:  OUT   LOCATION:  GYN                          FACILITY:  [facility]   PHYSICIAN:  De Blanch, M.D.DATE OF BIRTH:  [birth year]   DATE OF CONSULTATION:  03/29/2005  DATE OF DISCHARGE:                                   CONSULTATION   CHIEF COMPLAINT:  Vaginal drainage.   HISTORY OF PRESENT ILLNESS:  A 40 year old white female seen in consultation  at the request of Dr. Carrington Clamp, regarding chronic vaginal and vulvar  drainage.   The patient has a longstanding history of Crohn's disease dating back to her  freshman year of college.  She has undergone multiple surgical procedures,  ultimately undergoing a total colectomy including an abdominal perineal  resection.  Apparently following that surgical procedure, she developed what  sounds like a fistulous tract and an abscess which has been drained, but the  patient continues to have chronic drainage, some days worse than others.  She currently has an ileostomy which is permanent and functioning reasonably  well, although occasionally she gets obstructed which resolves with IV  fluids and NG suction.   In addition to vulvovaginal drainage, which is sometimes bloody but mostly  purulent and with mucus, she has considerable amount of pain and is unable  to have sexual intercourse.   From a gynecologic point of view, the patient has had regular cyclic  menstrual periods until approximately 3 months ago when she has had some  irregular periods.   OBSTETRICAL HISTORY:  Gravida 0.   CURRENT MEDICATIONS:  The patient is managed by [facility] and  is using a Duragesic patch, fentanyl lollipop p.r.n., and 5 mg of oral  oxycodone p.r.n.  She also takes multivitamins.   The patient has had an ultrasound of the pelvis recently showing endometrial  stripe of 5 mm, a normal uterus, and normal ovaries.   In August of this  because of a question of pyelonephritis, she underwent a CT urogram  revealing postoperative changes, no evidence of stones, normal kidneys, and  normal bladder.  Uterus and ovaries:  No free fluid was noted in the pelvis.  Pap smears have been normal, the most recent being in December 2005.   REVIEW OF SYSTEMS:  Otherwise negative.   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   PHYSICAL EXAMINATION:  VITAL SIGNS:  Weight 110 pounds.  GENERAL:  The patient is a pleasant white female in no acute distress.  HEENT:  Negative.  NECK:  Supple without thyromegaly.  ABDOMEN:  Soft and nontender.  She has an ileostomy in place.  Midline  incision is well-healed.  No hernias are noted.  PELVIC:  EG/BUS is essentially normal, although there is a dimple where her  anus used to be which admits a Q-tip for approximately 1 cm.  At that  juncture, it becomes too uncomfortable to proceed any further with probing.  Vaginal exam suggests an area extruding mucus or purulent material in the  right  lower vagina.  This is, again, difficult to evaluate because of  scarring across her perineal body.  Cervix appears normal.  Palpation of  this area brings out a small amount of purulent material out of the perianal  sinus.   IMPRESSION:  1.  Longstanding history of Crohn's disease which appears to be quiescent at      the present time.  2.  Chronic drainage from the vagina and vulva.  I suspect this represents      chronic abscess or sinus cavity tract.  It is difficult to fully assess      in the clinic given the patient's discomfort.  I would therefore      recommend that she undergo examination under anesthesia so we may fully      delineate her anatomy.  It may be that she will require excision of a      chronically draining sinus tract and/or opening the paravaginal space in      order to achieve full drainage and secondary closure by  granulation.      Certainly if the defect is too large, it may be closed using a      rotational flap of some sort.   We will schedule surgery for the near future.      De Blanch, M.D.  Electronically Signed     DC/MEDQ  D:  03/29/2005  T:  03/30/2005  Job:  [id number]   cc:   Telford Nab, R.N.  [address]  [place], [place] [address]   Carrington Clamp, M.D.  Fax: [phone]

## 2010-11-19 NOTE — Op Note (Signed)
[facility]  Patient:    Stephanie [name], Stephanie [name] Visit Number: [id number] MRN: [id number]          Service Type: DSU Location: DAY Attending Physician:  Carson Myrtle Dictated by:   Sheppard Plumber Earlene Plater, M.D. Proc. 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  POSTOPERATIVE DIAGNOSIS:  Persistent postoperative perineal sinus tract.  PROCEDURES:  Examination under anesthesia, debridement, and packing of perineal sinus.  SURGEON:  Timothy E. Earlene Plater, M.D.  ANESTHESIA:  General.  CLINICAL NOTE:  [name] is well known to me.  She lives and works here in [place].  She has long-standing Crohns disease.  She has been seen and cared for at [facility] for the majority of her medical care, having had a total abdominal colectomy with abdominal-perineal resection at [facility].  She follows closely with the gynecologic as well as the gastrointestinal department. 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  She has been seen and evaluated by Dr. Aleatha Borer, her gynecologist, and a persistent perineal sinus is noted.  Because it is so tender, she has been not able to be examined in the office.  Because she lives and works here and I have known her and her family for so long, she wished to proceed with me at this time for examination under anesthesia, debridement of fistula or sinus.  DESCRIPTION OF PROCEDURE:  The patient was adequately prepared, stable, and ready for this procedure.  She was identified and the permit signed.  She was taken to the operating room, put supine, and LMA anesthesia provided.  She was placed in lithotomy position.  Perineal area was inspected under magnification, then prepped and draped in the usual fashion.  I approached this situation very carefully and  gently.  The vagina was gently stretched open, visually inspected, and then a scope was placed into the vagina and the entire vaginal mucosa was carefully examined.  I saw no defects in the vaginal wall.  In the posterior fourchette at the vaginal opening entrance was a small abraded area.  This did not have any appearance like a fistula, and it was thought to be a simple surface irritation.  The opening in the perineum was where the anus would ordinarily be located.  It was approximately 2 cm in cephalocaudad dimension and when the buttocks were pulled apart, it was approximately 2 cm in right to left dimensions.  It was gently probed with a malleable probe and then under direct vision with magnification, it was gently debrided with a curette.  Those scrapings were sent to the lab.  There was a small horn of the sinus cavity that extended superiorly toward the vaginal wall, and deeper in the sinus cavity was a second horn that also approached the vaginal wall.  Careful visualization through both the vagina and the sinus revealed no evidence of a connection between the two.  I then injected methylene blue dye into the sinus tract under gentle pressure and could not see any evidence of dye in the vagina.  This was washed away with copious saline.  A further gentle debridement with a curette revealed the sinus tract to be clean, multilobulated as described, approximately 2.5 cm deep, but I could not confirm  any connection to any other organ structure.  I therefore packed the wound with iodoform gauze gently and placed a gauze pad over this. Again inspection of the vagina was negative.  With this, the patient was awakened and taken to the recovery room in good condition.  A careful description was gone over with her mother, and I will talk with the patient later.  Phenergan tablets 50 mg, #24, were given.  She has Percocet at home. She will be seen and followed as an outpatient. Dictated by:    Sheppard Plumber Earlene Plater, M.D. Attending Physician:  Carson Myrtle DD:  01/25/02 TD:  01/29/02 Job: [id number]

## 2010-11-19 NOTE — Discharge Summary (Signed)
   NAME:  Stephanie [name], Stephanie [name]                         ACCOUNT NO.:  [id number]   MEDICAL RECORD NO.:  [id number]                   PATIENT TYPE:  INP   LOCATION:  0365                                 FACILITY:  [facility]   PHYSICIAN:  Timothy E. Earlene Plater, M.D.              DATE OF BIRTH:  [birth year]   DATE OF ADMISSION:  02/17/2002  DATE OF DISCHARGE:  02/21/2002                                 DISCHARGE SUMMARY   FINAL DIAGNOSES:  1. Abdominal pain.  2. Diarrhea.   HISTORY OF PRESENT ILLNESS:  This is a complex young woman, 38, Caucasian  female, who has had Crohn's disease for years.  She underwent a total  abdominal proctocolectomy at [facility] two years ago with a permanent  end ileostomy.  She had one episode of bowel obstruction while at [facility].  She  now is having intermittent episodes of severe mid upper abdominal with  nausea and vomiting.  This occurred again on August 16 and 17.  She was  admitted on August 18 by Dr. Ronne Binning from the emergency room.  We have  requested GI consultation and the [place] [facility] service saw her on one  occasion, but because of the fact that she had changed her GI coverage  previously, they elected not to see her.  She improved dramatically.  The  pain dissipated.  The diarrhea slowed down and she was ready for discharge  on August 20 and was anxious to go home.  While here in the hospital we did  have a fluoroscopic guidance port access by radiology on  August 18.   She was admitted with a white count of 21,000 that dropped to 9.  Her other  laboratory data were normal.  Potassium was low at 3.3.  Total protein was  high at 9.5.  We had requested a small bowel series, which she elected to do  this at a later time.  Percocet #48 were given on discharge and she will be  seen and followed as an outpatient.  Chest x-ray was negative and  gallbladder ultrasound was negative.                                                 Timothy E. Earlene Plater, M.D.    TED/MEDQ  D:  02/28/2002  T:  02/28/2002  Job:  [id number]

## 2010-11-19 NOTE — Consult Note (Signed)
NAME:  Stephanie [name], LOW NO.:  [id number]   MEDICAL RECORD NO.:  [id number]          PATIENT TYPE:  OUT   LOCATION:  GYN                          FACILITY:  [facility]   PHYSICIAN:  De Blanch, M.D.DATE OF BIRTH:  12/08/1970   DATE OF CONSULTATION:  DATE OF DISCHARGE:                                   CONSULTATION   CHIEF COMPLAINT:  Postoperative followup.   INTERVAL HISTORY:  Since her last visit, the patient has done quite well.  She has no vaginal bleeding and no further significant discharge.  She is  having difficulty packing her introitus, and she feels that this is because  this area is closing in considerably.   She has had increasing difficulties with GI function, including nausea and  increased output from her ileostomy.  She is currently not under the care of  any gastroenterologist for her Crohn's disease and is not taking any  medications for her Crohn's.   HISTORY OF PRESENT ILLNESS:  Patient has a longstanding history of Crohn's  disease, having undergone multiple surgical procedures, including total  colectomy.  She underwent resection of multiple perineal vaginal fistulae in  November, 2006.  This area was left open to agranulate and has been doing  well during that time frame.  She has managed this conservatively.  She  notes some pain when she sits directly on her perineum.   OBSTETRICAL HISTORY:  Gravida 0.   CURRENT MEDICATIONS:  Duragesic patch.   SOCIAL HISTORY:  Patient is single.  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   FAMILY HISTORY:  Negative for gynecologic, breast, or colon cancer.   REVIEW OF SYSTEMS:  A 10-point comprehensive review of systems is negative,  except as noted above.   PHYSICAL EXAMINATION:  ABDOMEN:  Soft and nontender.  The ileostomy is  healthy.  PELVIC:  EG/BUS shows the area that was granulating is now nearly completely  epithelialized.  The junction between the perineal opening and the  posterior  vagina has a slight ridge to it.  It does admit two fingers, and the cervix  and uterus are otherwise normal.   IMPRESSION:  Excellent healing of a wide excision of the perineal body.  At  this juncture, in order to avoid stricturing of her vagina and introitus, we  will have the patient to begin to use a dilator twice daily.  She is given a  medium radiation therapy dilator and is taught how to insert it.  She will  return to see me in one month for continuing followup.   Because of her increasing GI symptoms, she is encouraged to see a  gastroenterologist for further evaluation.      De Blanch, M.D.  Electronically Signed     DC/MEDQ  D:  08/02/2005  T:  08/03/2005  Job:  [id number]   cc:   Carrington Clamp, M.D.  Fax: [phone]   Telford Nab, R.N.  [address]  [place], [place] [address]

## 2011-05-20 ENCOUNTER — Encounter: Payer: Self-pay | Admitting: Surgery

## 2011-06-10 ENCOUNTER — Encounter: Payer: Self-pay | Admitting: Surgery

## 2011-06-13 ENCOUNTER — Ambulatory Visit (INDEPENDENT_AMBULATORY_CARE_PROVIDER_SITE_OTHER): Payer: 59 | Admitting: Surgery

## 2011-06-13 ENCOUNTER — Ambulatory Visit (INDEPENDENT_AMBULATORY_CARE_PROVIDER_SITE_OTHER): Payer: 59 | Admitting: Vascular Surgery

## 2011-06-13 ENCOUNTER — Encounter: Payer: Self-pay | Admitting: Surgery

## 2011-06-13 VITALS — BP 124/85 | HR 88 | Resp 16 | Ht 66.0 in | Wt 193.7 lb

## 2011-06-13 DIAGNOSIS — O4190X0 Disorder of amniotic fluid and membranes, unspecified, unspecified trimester, not applicable or unspecified: Secondary | ICD-10-CM

## 2011-06-13 DIAGNOSIS — M7989 Other specified soft tissue disorders: Secondary | ICD-10-CM

## 2011-06-13 DIAGNOSIS — R609 Edema, unspecified: Secondary | ICD-10-CM

## 2011-06-13 NOTE — Progress Notes (Signed)
[facility]   Patient name: Stephanie [name] MRN: [id number] DOB: Dec 02, 1970 Sex: female   Referred by: Amelia Jo, M.D.  Reason for referral:  Chief Complaint  Patient presents with  . New Evaluation    ref per Dr. Clarisse Gouge for left UE edema; r/o venous occlusion    HISTORY OF PRESENT ILLNESS: I am seeing the patient today for further evaluation of a possible venous stenosis that might prevent her from using a tryptan medication for headaches. The patient has a history of Crohn's disease and has had multiple venipunctures in both arms. She complains of swelling particularly in the left arm. She's also had spiderlike veins on her left arm. She has a history of a Port-A-Cath those in place for proximally 6 years while she is getting Remicade during her treatment of her Crohn's.  She has been having migraine headaches. She's been discontinued off of Imitrex. There is concern that she may have a venous stenosis that would contraindicate the use of this medication.  She is been sent to see me for further evaluation.   Past Medical History  Diagnosis Date  . Crohn's disease   . GERD (gastroesophageal reflux disease)   . Depression with anxiety   . Abdominal pain     Past Surgical History  Procedure Date  . Vagina surgery     rectovaginal fistula repair  . Colon surgery     removal of rectum, anus and entire large colon  . Colostomy     History   Social History  . Marital Status: Single    Spouse Name: N/A    Number of Children: N/A  . Years of Education: N/A   Occupational History  . Not on file.   Social History Main Topics  . Smoking status: Former Smoker -- 10 years    Types: Cigarettes    Quit date: 07/04/2000  . Smokeless tobacco: Not on file   Comment: smoked 2-3 cigarettes/day  . Alcohol Use: Yes     occas. beer  . Drug Use: No  . Sexually Active: Not on file   Other Topics Concern  . Not on file   Social History Narrative  . No  narrative on file    Family History  Problem Relation Age of Onset  . Depression Maternal Aunt   . Heart attack Paternal Uncle   . Stroke Maternal Grandmother 47  . Depression Maternal Grandmother   . Nephrolithiasis Other   . Other Mother     chrons disease  . Cancer Paternal Grandfather     throat    Allergies as of 06/13/2011 - Review Complete 06/13/2011  Allergen Reaction Noted  . Flagyl (metronidazole hcl) Nausea And Vomiting 05/20/2011  . Stadol (butorphanol tartrate)  05/20/2011    Current Outpatient Prescriptions on File Prior to Visit  Medication Sig Dispense Refill  . ALPRAZolam (XANAX) 1 MG tablet Take 1 mg by mouth at bedtime as needed.        Marland Kitchen amphetamine-dextroamphetamine (ADDERALL) 20 MG tablet Take 20 mg by mouth 2 (two) times daily.        . Esterified Estrogens 2.5 MG TABS Take 5 mg by mouth daily.        . fentaNYL (DURAGESIC - DOSED MCG/HR) 100 MCG/HR Place 1 patch onto the skin every 3 (three) days.        . Norethindrone (ORTHO MICRONOR PO) Take by mouth.        Marland Kitchen oxycodone-acetaminophen (LYNOX) 5-300  MG per tablet Take 1 tablet by mouth every 6 (six) hours as needed.        . Vilazodone HCl (VIIBRYD PO) Take by mouth.           REVIEW OF SYSTEMS: Cardiovascular: No chest pain, chest pressure, palpitations, orthopnea, or dyspnea on exertion. No claudication or rest pain,  No history of DVT or phlebitis. Pulmonary: No productive cough, asthma or wheezing. Neurologic: positive for dizziness and headaches  Hematologic: No bleeding problems or clotting disorders. Musculoskeletal: Joint pain secondary to her Crohn's. Gastrointestinal: positive for Crohn's disease  Genitourinary: No dysuria or hematuria. Psychiatric:: Positive for depression and anxiety. Integumentary: No rashes or ulcers. Constitutional: No fever or chills.  PHYSICAL EXAMINATION: General: The patient appears their stated age.  Vital signs are BP 124/85  Pulse 88  Resp 16  Ht 5\' 6"   (1.676 m)  Wt 193 lb 11.2 oz (87.862 kg)  BMI 31.26 kg/m2 HEENT:  No gross abnormalities Pulmonary: Respirations are non-labored Musculoskeletal: There are no major deformities.   Neurologic: No focal weakness or paresthesias are detected, Skin: There are no ulcer or rashes noted. Venous telangiectasias and edema in both arms, the left is worse in the right Psychiatric: The patient has normal affect. Cardiovascular: Palpable radial pulse   Diagnostic Studies:  ultrasound was performed today which shows patent upper extremity deep venous system however there was some difficulty in evaluating the left radial vein. The superficial venous system was widely patent.      Assessment:   bilateral upper extremity swelling, left greater than right Plan: Base of the patient's venous duplex today I do not see any reason to withhold tryptanmedications. There is no evidence of venous stenosis identified. The inability to identify the radial vein could suggest occlusion. I do not think this would be hindered by the use of a vasoconstricting agent. Please contact me if there are any further questions with regards to this patient. I did discuss with her that if the swelling becomes a significant limitation to her that she might benefit from compression below this. I offered this to her today that she did not wish to do that. I did tell her to keep her arms elevated when possible would help with her swelling as well     V. Charlena Cross, M.D. [facility] [facility] Office: [phone] Pager:  [phone]

## 2011-06-20 NOTE — Procedures (Unsigned)
DUPLEX DEEP VENOUS EXAM - UPPER EXTREMITY  INDICATION:  Bilateral upper extremity edema.  HISTORY:  Edema:  Yes. Trauma/Surgery:  History of port and history of multiple IV's. Pain:  Yes, left upper extremity. PE:  No. Previous DVT:  No. Anticoagulants:  No. Other:  DUPLEX EXAM:                                            Bas/               IJV   SCV     AXV    BrachV  Ceph V               R  L  R   L   R  L   R   L   R  L Thrombosis    o  o  o   o   o  o   o   o   o  o Spontaneous   +  +  +   +   +  +   +   +   +  + Phasic        +  +  +   +   +  +   +   +   +  + Augmentation  +  +  +   +   +  +   +   +   +  + Compressible  +  +  +   +   +  +   +   +   +  + Competent     +  +  +   +   +  +   +   +   +  + Legend:  + - yes  o - no  p - partial  D - decreased   IMPRESSION: 1. Bilateral upper extremity deep veins appear patent with poor     visualization of the left radial veins, when visualized colorflow     and spectral Doppler could not be obtained; suggestive venous     thrombus.However, vessel is of small caliber and may have moved or     compressed. 2. Patent superficial venous system of the bilateral upper     extremities.  ___________________________________________ Quita Skye Hart [place], M.D.  SH/MEDQ  D:  06/13/2011  T:  06/13/2011  Job:  [id number]

## 2011-08-22 ENCOUNTER — Telehealth: Payer: Self-pay | Admitting: *Deleted

## 2011-08-22 NOTE — Telephone Encounter (Signed)
Called patient to schedule an appointment and had to leave a message. If the patient calls we can see her (Stephanie [name]) Thursday march 7 at 9am if that is a good time. If not please let me or Asher Muir know asap. She also needs to take her temp daily and keep a record until visit (per provider). If her temp gets above 100.5 she needs to call the office to be seen earlier (per provider). I also called her PCP to let them know we are trying to reach the patient to give her an appointment.

## 2011-08-23 ENCOUNTER — Telehealth: Payer: Self-pay | Admitting: *Deleted

## 2011-08-23 NOTE — Telephone Encounter (Signed)
Spoke with Dennie Bible at patients PCP office [phone]) and advised was unable to get in touch with patient to give her an appointment. She advised me to give it to her and she would get in touch with her and make sure she got the information. Thursday, March 7 @ 9am gave the address and phone number and advised her to keep a log of her temps and bring to the appointment.

## 2011-08-30 ENCOUNTER — Telehealth: Payer: Self-pay | Admitting: *Deleted

## 2011-08-30 NOTE — Telephone Encounter (Signed)
Patient called advised that she had received the appt date and was wondering if we could get her in sooner that next week (09/08/11) she thinks she is getting worse and her fevers are about 99-100.5 and more frequent, she is in pain and she just does not feel well. Advised her will call the provider and see if she can see her earlier than next week and give her a call back.  Spoke with the provider and after going over her schedule was decided that she can not be seen earlier we are going to see if any other provider can work her in before next week and call the patient back.

## 2011-08-31 ENCOUNTER — Telehealth: Payer: Self-pay | Admitting: *Deleted

## 2011-08-31 NOTE — Telephone Encounter (Signed)
Called patient and advised her that we will not be able to get her in any sooner that her 09/08/11 appt, due to the time we take with our new patients trying to get all the information we need in order to sucessfully find out whats wrong and treatthe patient. Advised her to call her PCP in the mean time and remember to keep a log of her temperatures with times to see if we can establish a pattern. Also advised her to call with any questions.  HAD TO LEAVE MESSAGE.

## 2011-09-02 ENCOUNTER — Encounter: Payer: Self-pay | Admitting: *Deleted

## 2011-09-02 DIAGNOSIS — R509 Fever, unspecified: Secondary | ICD-10-CM | POA: Insufficient documentation

## 2011-09-02 DIAGNOSIS — K5090 Crohn's disease, unspecified, without complications: Secondary | ICD-10-CM | POA: Insufficient documentation

## 2011-09-08 ENCOUNTER — Other Ambulatory Visit: Payer: Self-pay | Admitting: Internal Medicine

## 2011-09-08 ENCOUNTER — Ambulatory Visit (INDEPENDENT_AMBULATORY_CARE_PROVIDER_SITE_OTHER): Payer: 59 | Admitting: Internal Medicine

## 2011-09-08 ENCOUNTER — Telehealth: Payer: Self-pay | Admitting: *Deleted

## 2011-09-08 VITALS — BP 112/78 | HR 112 | Temp 98.3°F | Ht 66.0 in | Wt 188.0 lb

## 2011-09-08 DIAGNOSIS — R509 Fever, unspecified: Secondary | ICD-10-CM

## 2011-09-08 DIAGNOSIS — F32A Depression, unspecified: Secondary | ICD-10-CM

## 2011-09-08 DIAGNOSIS — N823 Fistula of vagina to large intestine: Secondary | ICD-10-CM

## 2011-09-08 DIAGNOSIS — F419 Anxiety disorder, unspecified: Secondary | ICD-10-CM

## 2011-09-08 DIAGNOSIS — F329 Major depressive disorder, single episode, unspecified: Secondary | ICD-10-CM

## 2011-09-08 DIAGNOSIS — N824 Other female intestinal-genital tract fistulae: Secondary | ICD-10-CM

## 2011-09-08 DIAGNOSIS — F411 Generalized anxiety disorder: Secondary | ICD-10-CM

## 2011-09-08 DIAGNOSIS — F3289 Other specified depressive episodes: Secondary | ICD-10-CM

## 2011-09-08 DIAGNOSIS — G43909 Migraine, unspecified, not intractable, without status migrainosus: Secondary | ICD-10-CM

## 2011-09-08 LAB — SEDIMENTATION RATE: Sed Rate: 20 mm/hr (ref 0–22)

## 2011-09-08 LAB — CBC WITH DIFFERENTIAL/PLATELET
Basophils Relative: 1 % (ref 0–1)
HCT: 40 % (ref 36.0–46.0)
Lymphocytes Relative: 34 % (ref 12–46)
Lymphs Abs: 3.2 10*3/uL (ref 0.7–4.0)
MCH: 29.7 pg (ref 26.0–34.0)
MCV: 89.3 fL (ref 78.0–100.0)
Monocytes Absolute: 0.5 10*3/uL (ref 0.1–1.0)
Monocytes Relative: 5 % (ref 3–12)
Neutro Abs: 5.7 10*3/uL (ref 1.7–7.7)
Neutrophils Relative %: 60 % (ref 43–77)
RBC: 4.48 MIL/uL (ref 3.87–5.11)
RDW: 13.2 % (ref 11.5–15.5)

## 2011-09-08 LAB — COMPREHENSIVE METABOLIC PANEL
ALT: 25 U/L (ref 0–35)
AST: 24 U/L (ref 0–37)
Albumin: 4.6 g/dL (ref 3.5–5.2)
Alkaline Phosphatase: 75 U/L (ref 39–117)
BUN: 6 mg/dL (ref 6–23)
Creat: 0.76 mg/dL (ref 0.50–1.10)
Glucose, Bld: 82 mg/dL (ref 70–99)
Total Protein: 7.8 g/dL (ref 6.0–8.3)

## 2011-09-08 LAB — LACTATE DEHYDROGENASE: LDH: 194 U/L (ref 94–250)

## 2011-09-08 NOTE — Telephone Encounter (Signed)
Patient refused the appointment for 09/29/11 as the provider recommended and advised she will call back when she can come in for a visit. She was advised that if the provider fills that day before she calls she that she will have to be pushed out to an April slot and she acknowledged this as ok.

## 2011-09-09 LAB — CULTURE, BLOOD (SINGLE)
Preliminary Report: NEGATIVE
Preliminary Report: NO GROWTH

## 2011-09-12 ENCOUNTER — Encounter: Payer: Self-pay | Admitting: Internal Medicine

## 2011-09-12 DIAGNOSIS — F329 Major depressive disorder, single episode, unspecified: Secondary | ICD-10-CM | POA: Insufficient documentation

## 2011-09-12 DIAGNOSIS — G43909 Migraine, unspecified, not intractable, without status migrainosus: Secondary | ICD-10-CM | POA: Insufficient documentation

## 2011-09-12 DIAGNOSIS — F32A Depression, unspecified: Secondary | ICD-10-CM | POA: Insufficient documentation

## 2011-09-12 DIAGNOSIS — F419 Anxiety disorder, unspecified: Secondary | ICD-10-CM | POA: Insufficient documentation

## 2011-09-12 DIAGNOSIS — N823 Fistula of vagina to large intestine: Secondary | ICD-10-CM | POA: Insufficient documentation

## 2011-09-12 LAB — QUANTIFERON TB GOLD ASSAY (BLOOD): Interferon Gamma Release Assay: NEGATIVE

## 2011-09-12 NOTE — Progress Notes (Signed)
INFECTIOUS DISEASES INITIAL VISIT CLINIC NOTE  RFV: Fevers of unknown origin  Subjective:    Patient ID: Stephanie [name], female    DOB: 06-Jul-1970, 41 y.o.   MRN: [id number]  HPI Ms. Floor is a 41yo Female with history of Crohn's disease diagnosed in early 2001s/p multiple GI surgeries x 40+. S/p total colectomy c/b rectovaginal fistula s/p repair. She is currently being followed by Dr. Ewing Schlein, last seen 6 months ago. She has not been on any immunosuppresants nor immune-modulators for greater than 5 yrs. She states that she has been suffering from intermittent fevers for the past 3.57yrs, and more frequent in the last 6 months.She states that these fevers can get as high as 102F and most often occur in the morning and early evening. She denies any recent weight loss, no chills, or nightsweats. No recent illness, uri, cold or flu. She does notice having arthritis to multiple joints for which she takes tylenol to help her arthralgias as well as fevers. Her prior work-up by her PCP, Dr. Thea Silversmith, has been negative, and thus referred for further evaluation.   In addition to fevers, she complains of having intermittent periods of shortness of breath at rest, not associated with wheezing, nor cough. She states that her symptoms are somewhat alleviated by her anti-anxiety medications. She also has noticed that more effulent is coming out inferior to her stoma where she is concerned for dysfunction. No erythema from her stoma, but she has not seen her GI nor stoma RN for further evaluation.  The patient was intermittently tearful during this visit, she mentioned that she is anxious to find out what is going on with her health. She also stated that she was upset driving to the appointment to the point where she pulled off the road in order to gain composure.  No recent travel. Not sexually active. No cat exposure. Has 1 small dog, healthy.  Active Ambulatory Problems    Diagnosis Date Noted  . Crohn disease  09/02/2011  . Fever of unknown origin 09/02/2011   Resolved Ambulatory Problems    Diagnosis Date Noted  . No Resolved Ambulatory Problems   Past Medical History  Diagnosis Date  . Crohn's disease   . GERD (gastroesophageal reflux disease)   . Depression with anxiety   . Abdominal pain    Prior to Admission medications   Medication Sig Start Date End Date Taking? Authorizing Provider  ALPRAZolam Prudy Feeler) 1 MG tablet Take 1 mg by mouth at bedtime as needed.     Yes Historical Provider, MD  amphetamine-dextroamphetamine (ADDERALL) 20 MG tablet Take 20 mg by mouth 2 (two) times daily.     Yes Historical Provider, MD  BuPROPion HCl (WELLBUTRIN PO) Take by mouth. Take three tabs daily (mg strength unknown)    Yes Historical Provider, MD  Esterified Estrogens 2.5 MG TABS Take 5 mg by mouth daily.     Yes Historical Provider, MD  fentaNYL (DURAGESIC - DOSED MCG/HR) 100 MCG/HR Place 1 patch onto the skin every 3 (three) days.     Yes Historical Provider, MD  Norethindrone (ORTHO MICRONOR PO) Take by mouth.     Yes Historical Provider, MD  oxycodone-acetaminophen (LYNOX) 5-300 MG per tablet Take 1 tablet by mouth every 6 (six) hours as needed.     Yes Historical Provider, MD  Topiramate (TOPAMAX PO) Take by mouth. Take 3 tabs at hs (mg strength unknown)    Yes Historical Provider, MD  Vilazodone HCl (VIIBRYD PO) Take by  mouth.     Yes Historical Provider, MD   Family history = mom has crohn's disease  History  Substance Use Topics  . Smoking status: Former Smoker -- 10 years    Types: Cigarettes    Quit date: 07/04/2000  . Smokeless tobacco: Not on file   Comment: smoked 2-3 cigarettes/day  . Alcohol Use: Yes     occas. beer    Review of Systems  Constitutional: positive for fever,  Negative for chills, diaphoresis, activity change, appetite change, fatigue and unexpected weight change.  HENT: Negative for congestion, sore throat, rhinorrhea, sneezing, trouble swallowing and sinus  pressure.  Eyes: Negative for photophobia and visual disturbance.  Respiratory: Negative for cough, chest tightness, wheezing or stridor but positive for shortness of breath,  Cardiovascular: Negative for chest pain, palpitations and leg swelling.  Gastrointestinal: occassional for nausea, vomiting,but no  abdominal pain, diarrhea, constipation, blood in stool, abdominal distention and anal bleeding.  Genitourinary: Negative for dysuria, hematuria, flank pain and difficulty urinating.  Musculoskeletal: Negative for myalgias, back pain, joint swelling, but positive for arthralgias Skin: Negative for color change, pallor, rash and wound.  Neurological: Negative for dizziness, tremors, weakness and light-headedness. Positive for headaches Hematological: Negative for adenopathy. Does not bruise/bleed easily.  Psychiatric/Behavioral: Negative for behavioral problems, confusion, sleep disturbance, dysphoric mood, decreased concentration and agitation. Positive for anxiety     Objective:   Physical Exam BP 112/78  Pulse 112  Temp(Src) 98.3 F (36.8 C) (Oral)  Ht 5\' 6"  (1.676 m)  Wt 188 lb (85.276 kg)  BMI 30.34 kg/m2  SpO2 96%  General Appearance:    Alert, cooperative, no distress, appears stated age  Head:    Normocephalic, without obvious abnormality, atraumatic  Eyes:    PERRL, conjunctiva/corneas clear, EOM's intact, fundi    benign, both eyes  Ears:    Normal TM's and external ear canals, both ears has elements of   Nose:   Nares normal, septum midline, mucosa normal, no drainage    or sinus tenderness  Throat:   Lips, mucosa, and tongue normal; teeth and gums normal  Neck:   Supple, symmetrical, trachea midline, no adenopathy;    thyroid:  no enlargement/tenderness/nodules; no carotid   bruit or JVD  Back:     Symmetric, no curvature, ROM normal, no CVA tenderness  Lungs:     Clear to auscultation bilaterally, respirations unlabored  Chest Wall:    No tenderness or deformity    Heart:    Regular rate and rhythm, S1 and S2 normal, no murmur, rub   or gallop     Abdomen:     Soft, non-tender, bowel sounds active all four quadrants,    no masses, no organomegaly; right lower quadrant stoma, no signs of inflammation        Extremities:   Extremities normal, atraumatic, no cyanosis or edema  Pulses:   2+ and symmetric all extremities  Skin:   Skin color, texture, turgor normal, no rashes or lesions  Lymph nodes:   Cervical, supraclavicular, and axillary nodes normal  Neurologic:   CNII-XII intact, normal strength, sensation and reflexes    throughout   Labs: CBC    Component Value Date/Time   WBC 9.6 09/08/2011 1125   RBC 4.48 09/08/2011 1125   HGB 13.3 09/08/2011 1125   HCT 40.0 09/08/2011 1125   PLT 454* 09/08/2011 1125   MCV 89.3 09/08/2011 1125   MCH 29.7 09/08/2011 1125   MCHC 33.3 09/08/2011 1125  RDW 13.2 09/08/2011 1125   LYMPHSABS 3.2 09/08/2011 1125   MONOABS 0.5 09/08/2011 1125   EOSABS 0.1 09/08/2011 1125   BASOSABS 0.1 09/08/2011 1125   BMET    Component Value Date/Time   NA 137 09/08/2011 1126   K 3.4* 09/08/2011 1126   CL 104 09/08/2011 1126   CO2 17* 09/08/2011 1126   GLUCOSE 82 09/08/2011 1126   BUN 6 09/08/2011 1126   CREATININE 0.76 09/08/2011 1126   CALCIUM 9.4 09/08/2011 1126    Lab Results  Component Value Date   ESRSEDRATE 20 09/08/2011   Lab Results  Component Value Date   CRP 0.96* 09/08/2011   ANA- negative RF < 20 Ferritin 57 LDH 194 HIV negative Quantiferon negative  MICRO: Blood cx x 2 NGTD     Assessment & Plan:  41 yo F with long standing history of crohn's disease s/p colectomy, not currently on medications who presents with reported fevers. She also subscribes to fevers, possible ongoing discharge from recto-vaginal fistula. Given the longevity of these fevers, i don't have a high suspicion they are due to infection, but possibly due to auto-immune process, malignancy, or other unknowns. Given her age group for malignancy, it would mostly be  a leukemia/lymphoma, however her cbc differential is normal. No lymphadenopathy on exam, and the patient does not subscribe to other "B" symptoms. We have ordered several blood tests today to rule out infections and other auto-immune processes. It appears that her ferritin and platelets, both acute phase reactants, are mildly elevated -- which i suspect can see in patient with crohn's. Her WBC and differential are within normal limits, LDH is normal. Blood cultures negative. HIV negative. Quantiferon for latent TB is negative.  I would recommend that the patient follow up with her gastroenterologist to not only evaluate her stoma but also see if these constellation of symptoms are seen in extra-GI Crohn's, and if she would benefit from initiation of immune modulators.  In regards to her possible ongoing vaginal discharge, she is to see her gynecologist in the coming 2 wks, where we would recommend vaginal exam and possible CT if it does appear that she has signs of symptoms of fistula.  If her symptoms have not improved after being seen by her GI, welcome to see her back in ID clinic. Will call patient to let her know the results of her work-up.  Pleasure to see Ms. Dareen Piano

## 2011-09-14 ENCOUNTER — Other Ambulatory Visit: Payer: Self-pay | Admitting: Obstetrics and Gynecology

## 2011-09-14 DIAGNOSIS — R0602 Shortness of breath: Secondary | ICD-10-CM

## 2011-09-14 DIAGNOSIS — R509 Fever, unspecified: Secondary | ICD-10-CM

## 2011-09-14 LAB — CULTURE, BLOOD (SINGLE): Organism ID, Bacteria: NO GROWTH

## 2011-09-16 ENCOUNTER — Other Ambulatory Visit: Payer: 59

## 2011-09-19 ENCOUNTER — Other Ambulatory Visit: Payer: 59

## 2011-09-21 ENCOUNTER — Other Ambulatory Visit: Payer: 59

## 2011-09-21 ENCOUNTER — Telehealth: Payer: Self-pay | Admitting: *Deleted

## 2011-09-21 NOTE — Telephone Encounter (Signed)
Patient adv she is scheduled for several imaging appts. CT of chest, pelvis, and abdomen and she wanted to let Dr Drue Second know. Advised her will let the provider know and we will be on the look out for the results soon.

## 2011-09-23 ENCOUNTER — Other Ambulatory Visit: Payer: 59

## 2011-09-27 ENCOUNTER — Ambulatory Visit
Admission: RE | Admit: 2011-09-27 | Discharge: 2011-09-27 | Disposition: A | Discharge status: Home / Self Care | Payer: 59 | Source: Ambulatory Visit | Attending: Obstetrics and Gynecology | Admitting: Obstetrics and Gynecology

## 2011-09-27 DIAGNOSIS — R509 Fever, unspecified: Secondary | ICD-10-CM

## 2011-09-27 DIAGNOSIS — R0602 Shortness of breath: Secondary | ICD-10-CM

## 2011-09-30 ENCOUNTER — Other Ambulatory Visit: Payer: Self-pay | Admitting: Obstetrics and Gynecology

## 2011-09-30 ENCOUNTER — Ambulatory Visit
Admission: RE | Admit: 2011-09-30 | Discharge: 2011-09-30 | Disposition: A | Discharge status: Home / Self Care | Payer: 59 | Source: Ambulatory Visit | Attending: Obstetrics and Gynecology | Admitting: Obstetrics and Gynecology

## 2011-09-30 DIAGNOSIS — R0602 Shortness of breath: Secondary | ICD-10-CM

## 2011-09-30 DIAGNOSIS — R509 Fever, unspecified: Secondary | ICD-10-CM

## 2011-12-01 ENCOUNTER — Emergency Department (HOSPITAL_COMMUNITY): Payer: Self-pay

## 2011-12-01 ENCOUNTER — Encounter (HOSPITAL_COMMUNITY): Payer: Self-pay | Admitting: Emergency Medicine

## 2011-12-01 ENCOUNTER — Emergency Department (HOSPITAL_COMMUNITY)
Admission: EM | Admit: 2011-12-01 | Discharge: 2011-12-02 | Disposition: A | Discharge status: Home / Self Care | Payer: Self-pay | Attending: Emergency Medicine | Admitting: Emergency Medicine

## 2011-12-01 DIAGNOSIS — R51 Headache: Secondary | ICD-10-CM | POA: Insufficient documentation

## 2011-12-01 DIAGNOSIS — K5090 Crohn's disease, unspecified, without complications: Secondary | ICD-10-CM | POA: Insufficient documentation

## 2011-12-01 DIAGNOSIS — E860 Dehydration: Secondary | ICD-10-CM | POA: Insufficient documentation

## 2011-12-01 DIAGNOSIS — Z933 Colostomy status: Secondary | ICD-10-CM | POA: Insufficient documentation

## 2011-12-01 DIAGNOSIS — R109 Unspecified abdominal pain: Secondary | ICD-10-CM | POA: Insufficient documentation

## 2011-12-01 DIAGNOSIS — R112 Nausea with vomiting, unspecified: Secondary | ICD-10-CM | POA: Insufficient documentation

## 2011-12-01 HISTORY — DX: Anxiety disorder, unspecified: F41.9

## 2011-12-01 LAB — URINE MICROSCOPIC-ADD ON

## 2011-12-01 LAB — URINALYSIS, ROUTINE W REFLEX MICROSCOPIC
Bilirubin Urine: NEGATIVE
Ketones, ur: >80 mg/dL — AB
Nitrite: NEGATIVE
Specific Gravity, Urine: 1.03 (ref 1.005–1.030)
Urobilinogen, UA: 0.2 mg/dL (ref 0.0–1.0)

## 2011-12-01 LAB — DIFFERENTIAL
Basophils Absolute: 0 10*3/uL (ref 0.0–0.1)
Basophils Relative: 0 % (ref 0–1)
Eosinophils Absolute: 0 10*3/uL (ref 0.0–0.7)
Monocytes Relative: 6 % (ref 3–12)
Neutrophils Relative %: 73 % (ref 43–77)

## 2011-12-01 LAB — BASIC METABOLIC PANEL
BUN: 9 mg/dL (ref 6–23)
GFR calc Af Amer: >90 mL/min (ref >90)
GFR calc non Af Amer: >90 mL/min (ref >90)
Potassium: 3.3 mEq/L — ABNORMAL LOW (ref 3.5–5.1)
Sodium: 135 mEq/L (ref 135–145)

## 2011-12-01 LAB — CBC
MCH: 30.3 pg (ref 26.0–34.0)
MCHC: 35.5 g/dL (ref 30.0–36.0)
Platelets: 426 10*3/uL — ABNORMAL HIGH (ref 150–400)
RDW: 12.2 % (ref 11.5–15.5)

## 2011-12-01 MED ORDER — ALUM & MAG HYDROXIDE-SIMETH 200-200-20 MG/5ML PO SUSP: 30.0000 mL | ORAL | Status: DC | PRN

## 2011-12-01 MED ORDER — ZOLPIDEM TARTRATE 5 MG PO TABS
10.0000 mg | ORAL_TABLET | Freq: Every evening | ORAL | Status: DC | PRN
  Administered 2011-12-01: 10 mg via ORAL
  Filled 2011-12-01: qty 2

## 2011-12-01 MED ORDER — IBUPROFEN 200 MG PO TABS
600.0000 mg | ORAL_TABLET | Freq: Three times a day (TID) | ORAL | Status: DC | PRN
  Administered 2011-12-02: 600 mg via ORAL
  Filled 2011-12-01: qty 3

## 2011-12-01 MED ORDER — ACETAMINOPHEN 325 MG PO TABS: 650.0000 mg | ORAL_TABLET | ORAL | Status: DC | PRN

## 2011-12-01 MED ORDER — ONDANSETRON HCL 4 MG PO TABS
4.0000 mg | ORAL_TABLET | Freq: Three times a day (TID) | ORAL | Status: DC | PRN
  Administered 2011-12-01: 4 mg via ORAL
  Filled 2011-12-01: qty 1

## 2011-12-01 MED ORDER — SODIUM CHLORIDE 0.9 % IV BOLUS (SEPSIS): 1000.0000 mL | Freq: Once | INTRAVENOUS | Status: DC

## 2011-12-01 MED ORDER — LORAZEPAM 1 MG PO TABS
1.0000 mg | ORAL_TABLET | Freq: Three times a day (TID) | ORAL | Status: DC | PRN
  Administered 2011-12-01: 1 mg via ORAL
  Filled 2011-12-01: qty 1

## 2011-12-01 MED ORDER — LORAZEPAM 2 MG/ML IJ SOLN
1.0000 mg | Freq: Once | INTRAMUSCULAR | Status: AC
Start: 2011-12-01 — End: 2011-12-01
  Administered 2011-12-01: 1 mg via INTRAMUSCULAR
  Filled 2011-12-01: qty 1

## 2011-12-01 MED ORDER — LORAZEPAM 2 MG/ML IJ SOLN
1.0000 mg | Freq: Once | INTRAMUSCULAR | Status: AC
Start: 2011-12-01 — End: 2011-12-01
  Administered 2011-12-01: 1 mg via INTRAVENOUS
  Filled 2011-12-01: qty 1

## 2011-12-01 MED ORDER — ZOLPIDEM TARTRATE 5 MG PO TABS: 5.0000 mg | ORAL_TABLET | Freq: Every evening | ORAL | Status: DC | PRN

## 2011-12-01 MED ORDER — LORAZEPAM 1 MG PO TABS: 1.0000 mg | ORAL_TABLET | Freq: Four times a day (QID) | ORAL | Status: DC | PRN

## 2011-12-01 MED ORDER — LORAZEPAM 1 MG PO TABS: 1.0000 mg | ORAL_TABLET | Freq: Three times a day (TID) | ORAL | Status: DC | PRN

## 2011-12-01 MED ORDER — LORAZEPAM 1 MG PO TABS
2.0000 mg | ORAL_TABLET | Freq: Once | ORAL | Status: AC
  Administered 2011-12-01: 2 mg via ORAL
  Filled 2011-12-01: qty 1

## 2011-12-01 MED ORDER — ONDANSETRON HCL 4 MG PO TABS: 4.0000 mg | ORAL_TABLET | Freq: Three times a day (TID) | ORAL | Status: DC | PRN

## 2011-12-01 MED ORDER — LORAZEPAM 2 MG/ML IJ SOLN: 1.0000 mg | Freq: Four times a day (QID) | INTRAMUSCULAR | Status: DC | PRN

## 2011-12-01 NOTE — ED Notes (Signed)
IV RN unable to place IV

## 2011-12-01 NOTE — ED Notes (Signed)
Pt frustrated, restless, hyper verbal at times-tolerating po fluids-will continue to monitor

## 2011-12-01 NOTE — ED Notes (Signed)
Per EMS, history of crohns disease-off pain meds for 1 week-going thru withdrawl

## 2011-12-01 NOTE — ED Provider Notes (Signed)
Patient seen and evaluated and blood drawn for labs by md.  Left brachial artery with 10 cc withdrawn due to lab and nursing unable to access venous.   Stephanie Quarry, MD 12/01/11 [phone]

## 2011-12-01 NOTE — ED Notes (Signed)
[id number]<AV> Expected date:<BR> Expected time:12:40 PM<BR> Means of arrival:<BR> Comments:<BR> M110 - 41yoF Anxiety, abd pain with chron&#39;s disease

## 2011-12-01 NOTE — ED Notes (Signed)
IV RN paged-attempted iv placement x2

## 2011-12-01 NOTE — ED Provider Notes (Signed)
History     CSN: [id number]  Arrival date & time 12/01/11  1240   First MD Initiated Contact with Patient 12/01/11 1247      Chief Complaint  Patient presents with  . Abdominal Pain    (Consider location/radiation/quality/duration/timing/severity/associated sxs/prior treatment) Patient is a 41 y.o. female presenting with abdominal pain. The history is provided by the patient.  Abdominal Pain The primary symptoms of the illness include abdominal pain, nausea and vomiting. The primary symptoms of the illness do not include fever, shortness of breath, diarrhea, hematemesis, hematochezia, dysuria or vaginal discharge. The current episode started 2 days ago.  Symptoms associated with the illness do not include back pain. Associated symptoms comments: The patient has a long history of Crohn's Disease ultimately treated by complete colectomy years ago. The patient complains of abdominal pain, dehydration after vomiting x 3 days and decreased output into colostomy bag. She reports "chronic fever" with chills. She states that she has been on chronic pain management with high doses of narcotics and has been weaning herself off of all medications that she considered addictive, including Fentanyl patch, Percocet, Ativan and Adderal. She took her last pain pill last night and states she started becoming very anxious over being completely out of pain meds. She states she still wants to detox but that it became harder today. No hallucinations, no SI/HI. Marland Kitchen    Past Medical History  Diagnosis Date  . Crohn's disease   . GERD (gastroesophageal reflux disease)   . Depression with anxiety   . Abdominal pain     Past Surgical History  Procedure Date  . Vagina surgery     rectovaginal fistula repair  . Colon surgery     removal of rectum, anus and entire large colon  . Colostomy     Family History  Problem Relation Age of Onset  . Depression Maternal Aunt   . Heart attack Paternal Uncle   . Stroke  Maternal Grandmother 13  . Depression Maternal Grandmother   . Nephrolithiasis Other   . Other Mother     chrons disease  . Cancer Paternal Grandfather     throat    History  Substance Use Topics  . Smoking status: Former Smoker -- 10 years    Types: Cigarettes    Quit date: 07/04/2000  . Smokeless tobacco: Not on file   Comment: smoked 2-3 cigarettes/day  . Alcohol Use: Yes     occas. beer    OB History    Grav Para Term Preterm Abortions TAB SAB Ect Mult Living                  Review of Systems  Constitutional: Negative for fever.  Respiratory: Negative for shortness of breath.   Cardiovascular: Negative for chest pain.  Gastrointestinal: Positive for nausea, vomiting and abdominal pain. Negative for diarrhea, hematochezia and hematemesis.  Genitourinary: Negative for dysuria and vaginal discharge.  Musculoskeletal: Negative for back pain.  Psychiatric/Behavioral: Positive for agitation. The patient is nervous/anxious.     Allergies  Flagyl and Stadol  Home Medications   Current Outpatient Rx  Name Route Sig Dispense Refill  . ALPRAZOLAM 1 MG PO TABS Oral Take 1 mg by mouth at bedtime as needed.      . AMPHETAMINE-DEXTROAMPHETAMINE 20 MG PO TABS Oral Take 20 mg by mouth 2 (two) times daily.      . FENTANYL 100 MCG/HR TD PT72 Transdermal Place 1 patch onto the skin every 3 (three) days.      Marland Kitchen  ORTHO MICRONOR PO Oral Take by mouth.      . OXYCODONE-ACETAMINOPHEN 5-300 MG PO TABS Oral Take 1 tablet by mouth every 6 (six) hours as needed.        BP 136/93  Pulse 77  Temp(Src) 98.3 F (36.8 C) (Oral)  Resp 16  SpO2 100%  LMP 11/30/2011  Physical Exam  Constitutional: She is oriented to person, place, and time. She appears well-developed and well-nourished. No distress.  HENT:  Head: Normocephalic.  Mouth/Throat: Mucous membranes are dry.  Neck: Normal range of motion.  Cardiovascular: Normal rate and regular rhythm.   No murmur heard. Pulmonary/Chest:  Effort normal and breath sounds normal.  Abdominal: Soft. Bowel sounds are normal. She exhibits no distension and no mass.       Abdomen diffusely tender without guarding. Colostomy bag in place with normal appearing stoma.   Musculoskeletal: Normal range of motion.  Neurological: She is alert and oriented to person, place, and time.  Psychiatric: Her mood appears anxious. Her speech is rapid and/or pressured. She is agitated. Cognition and memory are normal. She expresses no suicidal plans and no homicidal plans.    ED Course  Procedures (including critical care time)   Labs Reviewed  CBC  DIFFERENTIAL  BASIC METABOLIC PANEL  URINALYSIS, ROUTINE W REFLEX MICROSCOPIC   Results for orders placed during the hospital encounter of 12/01/11  CBC      Component Value Range   WBC 12.4 (*) 4.0 - 10.5 (K/uL)   RBC 4.69  3.87 - 5.11 (MIL/uL)   Hemoglobin 14.2  12.0 - 15.0 (g/dL)   HCT 16.1  09.6 - 04.5 (%)   MCV 85.3  78.0 - 100.0 (fL)   MCH 30.3  26.0 - 34.0 (pg)   MCHC 35.5  30.0 - 36.0 (g/dL)   RDW 40.9  81.1 - 91.4 (%)   Platelets 426 (*) 150 - 400 (K/uL)  DIFFERENTIAL      Component Value Range   Neutrophils Relative 73  43 - 77 (%)   Neutro Abs 9.0 (*) 1.7 - 7.7 (K/uL)   Lymphocytes Relative 21  12 - 46 (%)   Lymphs Abs 2.6  0.7 - 4.0 (K/uL)   Monocytes Relative 6  3 - 12 (%)   Monocytes Absolute 0.8  0.1 - 1.0 (K/uL)   Eosinophils Relative 0  0 - 5 (%)   Eosinophils Absolute 0.0  0.0 - 0.7 (K/uL)   Basophils Relative 0  0 - 1 (%)   Basophils Absolute 0.0  0.0 - 0.1 (K/uL)  BASIC METABOLIC PANEL      Component Value Range   Sodium 135  135 - 145 (mEq/L)   Potassium 3.3 (*) 3.5 - 5.1 (mEq/L)   Chloride 98  96 - 112 (mEq/L)   CO2 18 (*) 19 - 32 (mEq/L)   Glucose, Bld 88  70 - 99 (mg/dL)   BUN 9  6 - 23 (mg/dL)   Creatinine, Ser 7.82  0.50 - 1.10 (mg/dL)   Calcium 9.6  8.4 - 95.6 (mg/dL)   GFR calc non Af Amer >90  >90 (mL/min)   GFR calc Af Amer >90  >90 (mL/min)    URINALYSIS, ROUTINE W REFLEX MICROSCOPIC      Component Value Range   Color, Urine YELLOW  YELLOW    APPearance CLOUDY (*) CLEAR    Specific Gravity, Urine 1.030  1.005 - 1.030    pH 6.0  5.0 - 8.0    Glucose, UA  NEGATIVE  NEGATIVE (mg/dL)   Hgb urine dipstick LARGE (*) NEGATIVE    Bilirubin Urine NEGATIVE  NEGATIVE    Ketones, ur >80 (*) NEGATIVE (mg/dL)   Protein, ur >161 (*) NEGATIVE (mg/dL)   Urobilinogen, UA 0.2  0.0 - 1.0 (mg/dL)   Nitrite NEGATIVE  NEGATIVE    Leukocytes, UA SMALL (*) NEGATIVE   URINE MICROSCOPIC-ADD ON      Component Value Range   Squamous Epithelial / LPF FEW (*) RARE    WBC, UA 3-6  <3 (WBC/hpf)   RBC / HPF 21-50  <3 (RBC/hpf)   Bacteria, UA FEW (*) RARE    Casts GRANULAR CAST (*) NEGATIVE   Dg Abd Acute W/chest  12/01/2011  *RADIOLOGY REPORT*  Clinical Data: Right lower quadrant abdominal pain, nausea and vomiting.  History of Crohn's disease.  ACUTE ABDOMEN SERIES (ABDOMEN 2 VIEW & CHEST 1 VIEW)  Comparison: Previous examinations.  Findings: The heart remains normal in size and the lungs are clear. Normal bowel gas pattern without free peritoneal air.  Minimal scoliosis.  IMPRESSION: No acute abnormality.  Original Report Authenticated By: Darrol Angel, M.D.    No results found.   No diagnosis found. 1. Chronic abdominal pain 2. Crohn's History 3. Narcotic dependence   MDM  Assessment of abdominal pain in the setting of severe Crohns disease is unremarkable. Feel the abdominal pain is chronic and, now, not treated with narcotics per patient choice to wean off meds. She continues to want to be off all addictive medications, focusing on the narcotics at this point. Discussed [facility] evaluation for withdrawing off meds and patient requests assessment. Discussed with [facility] who will be in to see patient.        Rodena Medin, PA-C 12/01/11 1926

## 2011-12-02 ENCOUNTER — Encounter (HOSPITAL_COMMUNITY): Payer: Self-pay | Admitting: *Deleted

## 2011-12-02 MED ORDER — CLONIDINE HCL 0.1 MG PO TABS: 0.1000 mg | ORAL_TABLET | Freq: Three times a day (TID) | ORAL | Status: DC | PRN

## 2011-12-02 MED ORDER — METOCLOPRAMIDE HCL 5 MG/ML IJ SOLN
10.0000 mg | Freq: Once | INTRAMUSCULAR | Status: AC
  Administered 2011-12-02: 10 mg via INTRAVENOUS
  Filled 2011-12-02: qty 2

## 2011-12-02 MED ORDER — ONDANSETRON HCL 4 MG/2ML IJ SOLN
4.0000 mg | Freq: Once | INTRAMUSCULAR | Status: AC
  Administered 2011-12-02: 4 mg via INTRAVENOUS
  Filled 2011-12-02: qty 2

## 2011-12-02 MED ORDER — ONDANSETRON 8 MG PO TBDP: 8.0000 mg | ORAL_TABLET | Freq: Three times a day (TID) | ORAL | Status: AC | PRN

## 2011-12-02 MED ORDER — KETOROLAC TROMETHAMINE 30 MG/ML IJ SOLN
30.0000 mg | Freq: Once | INTRAMUSCULAR | Status: AC
  Administered 2011-12-02: 30 mg via INTRAVENOUS
  Filled 2011-12-02: qty 1

## 2011-12-02 MED ORDER — SODIUM CHLORIDE 0.9 % IV BOLUS (SEPSIS)
2000.0000 mL | Freq: Once | INTRAVENOUS | Status: AC
  Administered 2011-12-02: 2000 mL via INTRAVENOUS

## 2011-12-02 MED ORDER — ZOLPIDEM TARTRATE 5 MG PO TABS: 5.0000 mg | ORAL_TABLET | Freq: Every evening | ORAL | Status: DC | PRN

## 2011-12-02 NOTE — ED Notes (Signed)
Dr Patria Mane at bedside for IV attempt.

## 2011-12-02 NOTE — ED Notes (Signed)
Pt states increase in headache. Medication given as ordered. Pt states she feels very frustrated after talking with act team. Pt states I wish they will "just change my environment for 20 days."

## 2011-12-02 NOTE — ED Notes (Signed)
md informed of pt increasing anxiety. Medication given

## 2011-12-02 NOTE — ED Notes (Signed)
Pt states increase in anxiety. Pt states no relief from headache. Dr Nile Riggs informed.

## 2011-12-02 NOTE — ED Notes (Signed)
Pt informed of discharge plan. Pt attempting to contact family

## 2011-12-02 NOTE — ED Notes (Signed)
ACT team at bedside.  

## 2011-12-02 NOTE — ED Provider Notes (Signed)
6:56 AM Angiocath insertion Performed by: Lyanne Co Consent: Verbal consent obtained. Risks and benefits: risks, benefits and alternatives were discussed Time out: Immediately prior to procedure a "time out" was called to verify the correct patient, procedure, equipment, support staff and site/side marked as required. Preparation: Patient was prepped and draped in the usual sterile fashion. Vein Location: Right Basilic Ultrasound Guided Gauge: 20-gauge Normal blood return and flush without difficulty Patient tolerance: Patient tolerated the procedure well with no immediate complications.    I became involved in this patient's care much later in her emergency department stay.  On my assessment the patient has long-standing history of Crohn's has become dependent on her narcotics.  She's approximate 4-5 days out of her last narcotic use.  I suspect she became severely dehydrated over the past several days secondary to narcotic withdrawal.  Currently she is dehydrated and there is difficulty getting IV access on her.  I placed a ultrasound guided right basilic IV and her.  She was given 2 L of IV fluids 10 of IV Reglan for her headache as well as 30 of Toradol and feels much better at this time.  His no indication for inpatient admission for narcotic use/abuse.  The patient feels better would like to go home.  The patient be sent home on standard narcotic abuse medications as well as her chest for oral hydration.  She will return the emergency department for any new or worsening symptoms.  The behavior health team saw her and gave her outpatient resources for narcotic abuse  1. Dehydration   2. Headache         Lyanne Co, MD 12/02/11 0700

## 2011-12-02 NOTE — Discharge Instructions (Signed)
Dehydration, Adult Dehydration is when you lose more fluids from the body than you take in. Vital organs like the kidneys, brain, and heart cannot function without a proper amount of fluids and salt. Any loss of fluids from the body can cause dehydration.  CAUSES   Vomiting.   Diarrhea.   Excessive sweating.   Excessive urine output.   Fever.  SYMPTOMS  Mild dehydration  Thirst.   Dry lips.   Slightly dry mouth.  Moderate dehydration  Very dry mouth.   Sunken eyes.   Skin does not bounce back quickly when lightly pinched and released.   Dark urine and decreased urine production.   Decreased tear production.   Headache.  Severe dehydration  Very dry mouth.   Extreme thirst.   Rapid, weak pulse (more than 100 beats per minute at rest).   Cold hands and feet.   Not able to sweat in spite of heat and temperature.   Rapid breathing.   Blue lips.   Confusion and lethargy.   Difficulty being awakened.   Minimal urine production.   No tears.  DIAGNOSIS  Your caregiver will diagnose dehydration based on your symptoms and your exam. Blood and urine tests will help confirm the diagnosis. The diagnostic evaluation should also identify the cause of dehydration. TREATMENT  Treatment of mild or moderate dehydration can often be done at home by increasing the amount of fluids that you drink. It is best to drink small amounts of fluid more often. Drinking too much at one time can make vomiting worse. Refer to the home care instructions below. Severe dehydration needs to be treated at the hospital where you will probably be given intravenous (IV) fluids that contain water and electrolytes. HOME CARE INSTRUCTIONS   Ask your caregiver about specific rehydration instructions.   Drink enough fluids to keep your urine clear or pale yellow.   Drink small amounts frequently if you have nausea and vomiting.   Eat as you normally do.   Avoid:   Foods or drinks high in  sugar.   Carbonated drinks.   Juice.   Extremely hot or cold fluids.   Drinks with caffeine.   Fatty, greasy foods.   Alcohol.   Tobacco.   Overeating.   Gelatin desserts.   Wash your hands well to avoid spreading bacteria and viruses.   Only take over-the-counter or prescription medicines for pain, discomfort, or fever as directed by your caregiver.   Ask your caregiver if you should continue all prescribed and over-the-counter medicines.   Keep all follow-up appointments with your caregiver.  SEEK MEDICAL CARE IF:  You have abdominal pain and it increases or stays in one area (localizes).   You have a rash, stiff neck, or severe headache.   You are irritable, sleepy, or difficult to awaken.   You are weak, dizzy, or extremely thirsty.  SEEK IMMEDIATE MEDICAL CARE IF:   You are unable to keep fluids down or you get worse despite treatment.   You have frequent episodes of vomiting or diarrhea.   You have blood or green matter (bile) in your vomit.   You have blood in your stool or your stool looks black and tarry.   You have not urinated in 6 to 8 hours, or you have only urinated a small amount of very dark urine.   You have a fever.   You faint.  MAKE SURE YOU:   Understand these instructions.   Will watch your condition.     Will get help right away if you are not doing well or get worse.  Document Released: 06/20/2005 Document Revised: 06/09/2011 Document Reviewed: 02/07/2011 [employer] Patient Information 2012 [employer]. 

## 2011-12-02 NOTE — BH Assessment (Signed)
Assessment Note   Stephanie [name] is a 41 y.o. female who presents to [facility] with abdominal pain due to Crohn's Disease.  However, pt req detox from pain pills because of Chrohn's Disease(pt has colostomy bag).   Pt says she was prescribed Dilaudid, Oxycontin, Percocet, Fentanyl for pain and also had been attending a pain management clinic.  Pt reports she discontinued using Dilaudid and Oxycontin and stopped Fentanyl approx 1 wk ago.  Pt says she has only used prescribed Percocet(5mg ) and last use was on 11/30/11 in the AM.  Pt says she has not slept since 11/30/11, presents anxious and some deprssion due to health issues.  Pt denies SI/HI/Psych. States she cannot go home without detox from meds.  Pt reports experiencing w/d sxs: crawling skin chills, anxiety and fevers which she has been having for 3 yrs, not related to detox.  This Clinical research associate informed pt that she has past the timeframe of detox and outpt services would be sufficient for detox.  This Clinical research associate can provide referrals, also spoke with [facility].(AC w/[facility]) and she agreed with disposition. Talked with EDP, Dr. Patria Mane, he agreed with disposition, will d/c patient with referrals.  Axis I: Mood Disorder NOS and Substance Abuse Axis II: Deferred Axis III:  Past Medical History  Diagnosis Date  . Crohn's disease   . GERD (gastroesophageal reflux disease)   . Abdominal pain   . Anxiety    Axis IV: other psychosocial or environmental problems and problems with access to health care services Axis V: 51-60 moderate symptoms  Past Medical History:  Past Medical History  Diagnosis Date  . Crohn's disease   . GERD (gastroesophageal reflux disease)   . Abdominal pain   . Anxiety     Past Surgical History  Procedure Date  . Vagina surgery     rectovaginal fistula repair  . Colon surgery     removal of rectum, anus and entire large colon  . Colostomy     Family History:  Family History  Problem Relation Age of Onset  . Depression  Maternal Aunt   . Heart attack Paternal Uncle   . Stroke Maternal Grandmother 86  . Depression Maternal Grandmother   . Nephrolithiasis Other   . Other Mother     chrons disease  . Cancer Paternal Grandfather     throat    Social History:  reports that she quit smoking about 11 years ago. Her smoking use included Cigarettes. She quit after 10 years of use. She does not have any smokeless tobacco history on file. She reports that she uses illicit drugs (Fentanyl). She reports that she does not drink alcohol.  Additional Social History:  Alcohol / Drug Use Pain Medications: None  Prescriptions: None  Over the Counter: None  History of alcohol / drug use?: No history of alcohol / drug abuse Longest period of sobriety (when/how long): None  Withdrawal Symptoms: Fever / Chills (Anxiety )  CIWA: CIWA-Ar BP: 102/66 mmHg Pulse Rate: 89  Nausea and Vomiting: no nausea and no vomiting Tactile Disturbances: none Tremor: no tremor Auditory Disturbances: not present Paroxysmal Sweats: barely perceptible sweating, palms moist Visual Disturbances: not present Anxiety: mildly anxious Headache, Fullness in Head: none present Agitation: normal activity Orientation and Clouding of Sensorium: oriented and can do serial additions CIWA-Ar Total: 2  COWS: Clinical Opiate Withdrawal Scale (COWS) Resting Pulse Rate: Pulse Rate 81-100 Sweating: No report of chills or flushing Restlessness: Able to sit still Pupil Size: Pupils pinned or normal  size for room light Bone or Joint Aches: Mild diffuse discomfort Runny Nose or Tearing: Not present GI Upset: nausea or loose stool Tremor: No tremor Yawning: No yawning Anxiety or Irritability: Patient reports increasing irritability or anxiousness Gooseflesh Skin: Skin is smooth COWS Total Score: 5   Allergies:  Allergies  Allergen Reactions  . Flagyl (Metronidazole Hcl) Nausea And Vomiting  . Stadol (Butorphanol Tartrate) Other (See Comments)     Body feels hot, hallucinations     Home Medications:  (Not in a hospital admission)  OB/GYN Status:  Patient's last menstrual period was 12/01/2011.  General Assessment Data Location of Assessment: [facility] Living Arrangements: Parent Can pt return to current living arrangement?: Yes Admission Status: Voluntary Is patient capable of signing voluntary admission?: Yes Transfer from: Acute Hospital Referral Source: MD  Education Status Is patient currently in school?: No Current Grade: None  Highest grade of school patient has completed: None  Name of school: None  Contact person: None   Risk to self Suicidal Ideation: No Suicidal Intent: No Is patient at risk for suicide?: No Suicidal Plan?: No Access to Means: No What has been your use of drugs/alcohol within the last 12 months?: None  Previous Attempts/Gestures: No How many times?: 0  Other Self Harm Risks: None  Triggers for Past Attempts: None known Intentional Self Injurious Behavior: None Family Suicide History: No Recent stressful life event(s): Other (Comment) (Health Issues ) Persecutory voices/beliefs?: No Depression: No Depression Symptoms:  (None Reported ) Substance abuse history and/or treatment for substance abuse?: No Suicide prevention information given to non-admitted patients: Not applicable  Risk to Others Homicidal Ideation: No Thoughts of Harm to Others: No Current Homicidal Intent: No Current Homicidal Plan: No Access to Homicidal Means: No Identified Victim: None  History of harm to others?: No Assessment of Violence: None Noted Violent Behavior Description: None  Does patient have access to weapons?: No Criminal Charges Pending?: No Describe Pending Criminal Charges: None  Does patient have a court date: No  Psychosis Hallucinations: None noted Delusions: None noted  Mental Status Report Appear/Hygiene: Other (Comment) (Appropriate ) Eye Contact: Good Motor Activity:  Unremarkable Speech: Logical/coherent Level of Consciousness: Alert Mood: Anxious Affect: Anxious Anxiety Level: Minimal Thought Processes: Coherent;Relevant Judgement: Unimpaired Orientation: Person;Place;Time;Situation Obsessive Compulsive Thoughts/Behaviors: None  Cognitive Functioning Concentration: Normal Memory: Recent Intact;Remote Intact IQ: Average Insight: Good Impulse Control: Good Appetite: Good Weight Loss: 0  Weight Gain: 0  Sleep: No Change Total Hours of Sleep: 0  (No sleep since 05/29) Vegetative Symptoms: None  ADLScreening [facility] Assessment Services) Patient's cognitive ability adequate to safely complete daily activities?: Yes Patient able to express need for assistance with ADLs?: Yes Independently performs ADLs?: Yes  Abuse/Neglect [facility]) Physical Abuse: Denies Verbal Abuse: Denies Sexual Abuse: Denies  Prior Inpatient Therapy Prior Inpatient Therapy: No Prior Therapy Dates: None  Prior Therapy Facilty/Provider(s): None  Reason for Treatment: None   Prior Outpatient Therapy Prior Outpatient Therapy: No Prior Therapy Dates: None  Prior Therapy Facilty/Provider(s): None  Reason for Treatment: None   ADL Screening (condition at time of admission) Patient's cognitive ability adequate to safely complete daily activities?: Yes Patient able to express need for assistance with ADLs?: Yes Independently performs ADLs?: Yes Weakness of Legs: None Weakness of Arms/Hands: None  Home Assistive Devices/Equipment Home Assistive Devices/Equipment: None  Therapy Consults (therapy consults require a physician order) PT Evaluation Needed: No OT Evalulation Needed: No SLP Evaluation Needed: No Abuse/Neglect Assessment (Assessment to be complete while patient is  alone) Physical Abuse: Denies Verbal Abuse: Denies Sexual Abuse: Denies Exploitation of patient/patient's resources: Denies Self-Neglect: Denies Values / Beliefs Cultural Requests During  Hospitalization: None Spiritual Requests During Hospitalization: None Consults Spiritual Care Consult Needed: No Social Work Consult Needed: No Merchant navy officer (For Healthcare) Advance Directive: Patient does not have advance directive;Patient would not like information Pre-existing out of facility DNR order (yellow form or pink MOST form): No Nutrition Screen Unintentional weight loss greater than 10lbs within the last month: No Problems chewing or swallowing foods and/or liquids: No Home Tube Feeding or Total Parenteral Nutrition (TPN): No Patient appears severely malnourished: No Pregnant or Lactating: No  Additional Information 1:1 In Past 12 Months?: No CIRT Risk: No Elopement Risk: No Does patient have medical clearance?: Yes     Disposition:  Disposition Disposition of Patient: Outpatient treatment Type of outpatient treatment: Adult  On Site Evaluation by:   Reviewed with Physician:     Murrell Redden 12/02/2011 3:14 AM

## 2011-12-07 ENCOUNTER — Encounter (HOSPITAL_COMMUNITY): Payer: Self-pay | Admitting: *Deleted

## 2011-12-07 ENCOUNTER — Emergency Department (HOSPITAL_COMMUNITY)
Admission: EM | Admit: 2011-12-07 | Discharge: 2011-12-08 | Disposition: A | Discharge status: Other Acute Inpatient Hospital | Payer: Self-pay | Attending: Emergency Medicine | Admitting: Emergency Medicine

## 2011-12-07 DIAGNOSIS — F329 Major depressive disorder, single episode, unspecified: Secondary | ICD-10-CM | POA: Insufficient documentation

## 2011-12-07 DIAGNOSIS — F1193 Opioid use, unspecified with withdrawal: Secondary | ICD-10-CM

## 2011-12-07 DIAGNOSIS — F19939 Other psychoactive substance use, unspecified with withdrawal, unspecified: Secondary | ICD-10-CM | POA: Insufficient documentation

## 2011-12-07 DIAGNOSIS — F1120 Opioid dependence, uncomplicated: Secondary | ICD-10-CM | POA: Insufficient documentation

## 2011-12-07 DIAGNOSIS — F32A Depression, unspecified: Secondary | ICD-10-CM

## 2011-12-07 DIAGNOSIS — F3289 Other specified depressive episodes: Secondary | ICD-10-CM | POA: Insufficient documentation

## 2011-12-07 DIAGNOSIS — Z79899 Other long term (current) drug therapy: Secondary | ICD-10-CM | POA: Insufficient documentation

## 2011-12-07 DIAGNOSIS — F1123 Opioid dependence with withdrawal: Secondary | ICD-10-CM

## 2011-12-07 DIAGNOSIS — E860 Dehydration: Secondary | ICD-10-CM | POA: Insufficient documentation

## 2011-12-07 DIAGNOSIS — Z933 Colostomy status: Secondary | ICD-10-CM | POA: Insufficient documentation

## 2011-12-07 DIAGNOSIS — K5090 Crohn's disease, unspecified, without complications: Secondary | ICD-10-CM | POA: Insufficient documentation

## 2011-12-07 LAB — POCT I-STAT, CHEM 8
BUN: 10 mg/dL (ref 6–23)
Calcium, Ion: 1.03 mmol/L — ABNORMAL LOW (ref 1.12–1.32)
Chloride: 108 mEq/L (ref 96–112)
Creatinine, Ser: 0.7 mg/dL (ref 0.50–1.10)
TCO2: 21 mmol/L (ref 0–100)

## 2011-12-07 LAB — COMPREHENSIVE METABOLIC PANEL
ALT: 75 U/L — ABNORMAL HIGH (ref 0–35)
AST: 68 U/L — ABNORMAL HIGH (ref 0–37)
Albumin: 4.7 g/dL (ref 3.5–5.2)
CO2: 20 mEq/L (ref 19–32)
Calcium: 9.8 mg/dL (ref 8.4–10.5)
Creatinine, Ser: 0.64 mg/dL (ref 0.50–1.10)
Sodium: 133 mEq/L — ABNORMAL LOW (ref 135–145)
Total Protein: 9.2 g/dL — ABNORMAL HIGH (ref 6.0–8.3)

## 2011-12-07 LAB — CBC
MCH: 30.9 pg (ref 26.0–34.0)
MCHC: 35.2 g/dL (ref 30.0–36.0)
MCV: 88 fL (ref 78.0–100.0)
Platelets: 337 10*3/uL (ref 150–400)
RBC: 5.17 MIL/uL — ABNORMAL HIGH (ref 3.87–5.11)
RDW: 12.5 % (ref 11.5–15.5)

## 2011-12-07 LAB — ACETAMINOPHEN LEVEL: Acetaminophen (Tylenol), Serum: <15 ug/mL (ref 10–30)

## 2011-12-07 LAB — RAPID URINE DRUG SCREEN, HOSP PERFORMED
Amphetamines: NOT DETECTED
Benzodiazepines: NOT DETECTED
Cocaine: NOT DETECTED
Opiates: NOT DETECTED
Tetrahydrocannabinol: NOT DETECTED

## 2011-12-07 MED ORDER — NAPROXEN 500 MG PO TABS
500.0000 mg | ORAL_TABLET | Freq: Two times a day (BID) | ORAL | Status: DC | PRN
  Administered 2011-12-07: 500 mg via ORAL
  Filled 2011-12-07: qty 1

## 2011-12-07 MED ORDER — ONDANSETRON HCL 4 MG/2ML IJ SOLN
4.0000 mg | Freq: Once | INTRAMUSCULAR | Status: AC
  Administered 2011-12-07: 4 mg via INTRAVENOUS
  Filled 2011-12-07: qty 2

## 2011-12-07 MED ORDER — SODIUM CHLORIDE 0.9 % IV BOLUS (SEPSIS)
1000.0000 mL | Freq: Once | INTRAVENOUS | Status: AC
  Administered 2011-12-07: 1000 mL via INTRAVENOUS

## 2011-12-07 MED ORDER — DIPHENHYDRAMINE HCL 50 MG/ML IJ SOLN
25.0000 mg | Freq: Once | INTRAMUSCULAR | Status: AC
  Administered 2011-12-07: 25 mg via INTRAVENOUS
  Filled 2011-12-07: qty 1

## 2011-12-07 MED ORDER — LORAZEPAM 2 MG/ML IJ SOLN
1.0000 mg | Freq: Once | INTRAMUSCULAR | Status: AC
  Administered 2011-12-07: 1 mg via INTRAVENOUS
  Filled 2011-12-07: qty 1

## 2011-12-07 MED ORDER — DICYCLOMINE HCL 20 MG PO TABS: 20.0000 mg | ORAL_TABLET | Freq: Four times a day (QID) | ORAL | Status: DC | PRN

## 2011-12-07 MED ORDER — METHOCARBAMOL 500 MG PO TABS: 500.0000 mg | ORAL_TABLET | Freq: Three times a day (TID) | ORAL | Status: DC | PRN

## 2011-12-07 MED ORDER — ONDANSETRON 4 MG PO TBDP
4.0000 mg | ORAL_TABLET | Freq: Four times a day (QID) | ORAL | Status: DC | PRN
  Administered 2011-12-07 – 2011-12-08 (×3): 4 mg via ORAL
  Filled 2011-12-07 (×3): qty 1

## 2011-12-07 MED ORDER — METOCLOPRAMIDE HCL 5 MG/ML IJ SOLN
10.0000 mg | Freq: Once | INTRAMUSCULAR | Status: AC
  Administered 2011-12-07: 10 mg via INTRAVENOUS
  Filled 2011-12-07: qty 2

## 2011-12-07 MED ORDER — LOPERAMIDE HCL 2 MG PO CAPS: 2.0000 mg | ORAL_CAPSULE | ORAL | Status: DC | PRN

## 2011-12-07 MED ORDER — HYDROXYZINE HCL 25 MG PO TABS
25.0000 mg | ORAL_TABLET | Freq: Four times a day (QID) | ORAL | Status: DC | PRN
  Administered 2011-12-07: 25 mg via ORAL
  Filled 2011-12-07: qty 1

## 2011-12-07 MED ORDER — SODIUM CHLORIDE 0.9 % IV SOLN: INTRAVENOUS | Status: DC

## 2011-12-07 NOTE — ED Notes (Signed)
Pt here for c/o dehydration. States she was here last Thursday d/t dehydration symptoms from pain medication detox. Discharged Thursday evening. States she has not improved at all since. Pt currently detoxing from fentanyl patch, percocet and depression medication.

## 2011-12-07 NOTE — ED Notes (Addendum)
Mother cell [phone] Luanna Cole)

## 2011-12-07 NOTE — ED Notes (Signed)
Gave patient some graham crackers and a sprite

## 2011-12-07 NOTE — ED Notes (Signed)
Patient placed in blue scrubs

## 2011-12-07 NOTE — ED Notes (Signed)
Iv attempt x 1 with 24g, unsuccessful. Fleet Contras RN informed.

## 2011-12-07 NOTE — ED Notes (Signed)
IV attempt x 2 without success. Second nurse in to look. Dr. Lynelle Doctor made aware.

## 2011-12-07 NOTE — ED Notes (Signed)
Asked patient to provide urine sample.  Patient stated unable to at this time.

## 2011-12-07 NOTE — BH Assessment (Addendum)
Assessment Note   Stephanie [name] is an 41 y.o. female. Pt has serious, long term crohn's disease with numerous previous surgeries.  Pt reports she has been on pain meds for 15 years with recent doses quite high.  Pt continues to have medical complications and reports she has been depressed for the past year.  Pt does report significant withdrawal symptoms--pt has colostomy bag, which makes withdrawal symptoms more complicated--reports that any liquids she currently takes in come right back out the bag.  Pt reports multiple opiate withdrawal symptoms--dates of last use around 5/30. Pt reports she contact her pain mgmt Dr Thyra Breed of [facility] but he would not assist with detox.  Pt quit taking the pain meds on her own.  Pt reports that she has had SI over the past week, including prior to coming to [facility] today.  Pt denies current plan, has had thoughts of overdose in past week.  Pt reports she cannot contract for safety at this time.  Pt denies HI/AV.  Axis I: Major Depression, single episode, Opioide dependence  Axis II: Deferred Axis III:  Past Medical History  Diagnosis Date  . Crohn's disease   . GERD (gastroesophageal reflux disease)   . Abdominal pain   . Anxiety    Axis IV: medical issues Axis V: 31-40 impairment in reality testing  Past Medical History:  Past Medical History  Diagnosis Date  . Crohn's disease   . GERD (gastroesophageal reflux disease)   . Abdominal pain   . Anxiety     Past Surgical History  Procedure Date  . Vagina surgery     rectovaginal fistula repair  . Colon surgery     removal of rectum, anus and entire large colon  . Colostomy     Family History:  Family History  Problem Relation Age of Onset  . Depression Maternal Aunt   . Heart attack Paternal Uncle   . Stroke Maternal Grandmother 65  . Depression Maternal Grandmother   . Nephrolithiasis Other   . Other Mother     chrons disease  . Cancer Paternal Grandfather     throat      Social History:  reports that she quit smoking about 11 years ago. Her smoking use included Cigarettes. She quit after 10 years of use. She does not have any smokeless tobacco history on file. She reports that she does not drink alcohol or use illicit drugs.  Additional Social History:  Alcohol / Drug Use Pain Medications: yes currently Prescriptions: yes currently Over the Counter: na History of alcohol / drug use?: Yes Substance #1 Name of Substance 1: Percocet 1 - Age of First Use: 26 1 - Amount (size/oz): 5mg  1 - Frequency: 4-5x day 1 - Duration: 15 years 1 - Last Use / Amount: 5/30, 1/4th of a 5mg  tab Substance #2 Name of Substance 2: fentenyl patch 2 - Age of First Use: unk 2 - Amount (size/oz): 50mg  patch 2 - Frequency: every 3 days 2 - Duration: unknown 2 - Last Use / Amount: 11/21/11 Substance #3 Name of Substance 3: xanax 3 - Amount (size/oz): 3-4mg  3 - Frequency: daily 3 - Duration: 7 years 3 - Last Use / Amount: 5/27, 3mg   CIWA: CIWA-Ar BP: 121/77 mmHg Pulse Rate: 75  COWS: Clinical Opiate Withdrawal Scale (COWS) Resting Pulse Rate: Pulse Rate 81-100 Sweating: Flushed or Observable moistness on face Restlessness: Frequent shifting or extraneous movements of legs/arms Pupil Size: Pupils possibly larger than normal for room  light Bone or Joint Aches: Patient reports sever diffuse aching of joints/muscles Runny Nose or Tearing: Not present GI Upset: nausea or loose stool Tremor: Slight tremor observable Yawning: No yawning Anxiety or Irritability: Patient obviously irritable/anxious Gooseflesh Skin: Piloerection of skin can be felt or hairs standing up on arms COWS Total Score: 18   Allergies:  Allergies  Allergen Reactions  . Flagyl (Metronidazole Hcl) Nausea And Vomiting  . Stadol (Butorphanol Tartrate) Other (See Comments)    Body feels hot, hallucinations     Home Medications:  (Not in a hospital admission)  OB/GYN Status:  Patient's last  menstrual period was 12/01/2011.  General Assessment Data [facility]: [facility] ACT Assessment: Yes Living Arrangements: Parent Can pt return to current living arrangement?: Yes Admission Status: Voluntary Is patient capable of signing voluntary admission?: Yes Transfer from: Home Referral Source: Self/Family/Friend     Risk to self Suicidal Ideation: No-Not Currently/Within Last 6 Months Suicidal Intent: No Is patient at risk for suicide?: Yes Suicidal Plan?: No-Not Currently/Within Last 6 Months Access to Means: No What has been your use of drugs/alcohol within the last 12 months?: prescription pain med use past 15 years Previous Attempts/Gestures: No Intentional Self Injurious Behavior: None Family Suicide History: No Recent stressful life event(s): Other (Comment) (severe medical issues) Persecutory voices/beliefs?: No Depression: Yes Depression Symptoms: Despondent;Insomnia;Tearfulness;Isolating;Fatigue;Guilt;Loss of interest in usual pleasures;Feeling angry/irritable Substance abuse history and/or treatment for substance abuse?: No Suicide prevention information given to non-admitted patients: Not applicable  Risk to Others Homicidal Ideation: No Thoughts of Harm to Others: No Current Homicidal Intent: No Current Homicidal Plan: No Access to Homicidal Means: No History of harm to others?: No Assessment of Violence: None Noted Does patient have access to weapons?: Yes (Comment) (several guns in home) Criminal Charges Pending?: No Does patient have a court date: No  Psychosis Hallucinations: None noted Delusions: None noted  Mental Status Report Appear/Hygiene: Other (Comment) (casual) Eye Contact: Good Motor Activity: Agitation Speech: Logical/coherent;Pressured Level of Consciousness: Alert;Restless Mood: Anxious Affect: Appropriate to circumstance Anxiety Level: Moderate Thought Processes: Coherent;Relevant Judgement: Unimpaired Orientation:  Person;Place;Time;Situation Obsessive Compulsive Thoughts/Behaviors: None  Cognitive Functioning Concentration: Normal Memory: Recent Intact;Remote Intact IQ: Average Insight: Good Impulse Control: Fair Appetite: Fair Weight Loss:  (possible) Weight Gain: 0  Sleep: Decreased Total Hours of Sleep: 2  Vegetative Symptoms: None  ADLScreening [facility] Assessment Services) Patient's cognitive ability adequate to safely complete daily activities?: Yes Patient able to express need for assistance with ADLs?: Yes Independently performs ADLs?: Yes  Abuse/Neglect [facility]) Physical Abuse: Denies Verbal Abuse: Denies Sexual Abuse: Denies  Prior Inpatient Therapy Prior Inpatient Therapy: No  Prior Outpatient Therapy Prior Outpatient Therapy: Yes (Also saw Dr Evelene Croon from 2005-2012 for meds) Prior Therapy Dates: 2008, 2010 Prior Therapy Facilty/Provider(s): 2 Outpt therapists Reason for Treatment: depression  ADL Screening (condition at time of admission) Patient's cognitive ability adequate to safely complete daily activities?: Yes Patient able to express need for assistance with ADLs?: Yes Independently performs ADLs?: Yes Weakness of Legs: None Weakness of Arms/Hands: None  Home Assistive Devices/Equipment Home Assistive Devices/Equipment: None    Abuse/Neglect Assessment (Assessment to be complete while patient is alone) Physical Abuse: Denies Verbal Abuse: Denies Sexual Abuse: Denies Exploitation of patient/patient's resources: Denies Self-Neglect: Denies Values / Beliefs Cultural Requests During Hospitalization: None Spiritual Requests During Hospitalization: None   Advance Directives (For Healthcare) Advance Directive: Patient has advance directive, copy not in chart Type of Advance Directive: Living will Advance Directive not in Chart: Copy  requested from family    Additional Information 1:1 In Past 12 Months?: No CIRT Risk: No Elopement Risk: No Does patient have  medical clearance?: Yes     Disposition: Pt referred to [facility] for inpt treatment. 06/06,0130.  [facility] declines to admit pt.  [facility] contacted and reports that they cannot admit someone with pt's level of medical issues. Disposition Disposition of Patient: Inpatient treatment program Type of inpatient treatment program: Adult Type of outpatient treatment: Adult  On Site Evaluation by:   Reviewed with Physician:     Lorri Frederick 12/07/2011 10:46 PM

## 2011-12-07 NOTE — ED Notes (Signed)
Patient requested an IV.RN notified

## 2011-12-07 NOTE — ED Provider Notes (Signed)
History     CSN: [id number]  Arrival date & time 12/07/11  0902   First MD Initiated Contact with Patient 12/07/11 1002      Chief Complaint  Patient presents with  . Dehydration  . Medical Clearance    (Consider location/radiation/quality/duration/timing/severity/associated sxs/prior treatment) HPI  Patient relates she has had Crohn's disease for the past 21 years. She relates for the past 15 years she's been high-dose her chronic and psychiatric medications. She has been going to [facility] pain management and treated by Dr. Vear Clock. She states she has recently expressed an interest in coming off of her narcotics because "I'm tired of living in a fall". She relates she did have some minor tapering of her Percocet however she has recently lost her insurance and can no longer by her medications. She relates her last fentanyl patch was 2 weeks ago, her last half Percocet was 6 days ago, the last day and next was 9 days ago, her last adderal was 1 week ago and her last SSRI was one week ago. She relates for the past week she's had nausea and vomiting to the point now where she just has dry heaves. She states if she drinks anything it neatly comes out her colostomy as liquid. She states she's not having formed stool. Patient has a colostomy after having a colon resection for her Crohns and a rectal vaginal fistula.. she reports she's been having fever for the past 3 years and has been seen by infectious disease with no etiology found. She states she hurts all over, she also feels depressed and is having suicidal thoughts stating "I can't take this any longer". She states she's not sleeping. She also reports getting hormonal headaches and is being treated by Dr. Melvyn Neth neurologist.  PCP none GI Dr. Ewing Schlein GYN Dr. Henderson Cloud Pain management Dr. Vear Clock  Past Medical History  Diagnosis Date  . Crohn's disease   . GERD (gastroesophageal reflux disease)   . Abdominal pain   . Anxiety     Past  Surgical History  Procedure Date  . Vagina surgery     rectovaginal fistula repair  . Colon surgery     removal of rectum, anus and entire large colon  . Colostomy     Family History  Problem Relation Age of Onset  . Depression Maternal Aunt   . Heart attack Paternal Uncle   . Stroke Maternal Grandmother 10  . Depression Maternal Grandmother   . Nephrolithiasis Other   . Other Mother     chrons disease  . Cancer Paternal Grandfather     throat    History  Substance Use Topics  . Smoking status: Former Smoker -- 10 years    Types: Cigarettes    Quit date: 07/04/2000  . Smokeless tobacco: Not on file   Comment: smoked 2-3 cigarettes/day  . Alcohol Use: No     occas. beer  employed  OB History    Grav Para Term Preterm Abortions TAB SAB Ect Mult Living                  Review of Systems  All other systems reviewed and are negative.    Allergies  Flagyl and Stadol  Home Medications   Current Outpatient Rx  Name Route Sig Dispense Refill  . ALPRAZOLAM 1 MG PO TABS Oral Take 1 mg by mouth at bedtime as needed. For anxiety.    Marland Kitchen CLONIDINE HCL 0.1 MG PO TABS Oral Take 1 tablet (0.1  mg total) by mouth 3 (three) times daily as needed (withdrawl symptoms). 15 tablet 0  . ONDANSETRON 8 MG PO TBDP Oral Take 1 tablet (8 mg total) by mouth every 8 (eight) hours as needed for nausea. 10 tablet 0  . PROMETHAZINE HCL 25 MG PO TABS Oral Take 25 mg by mouth every 6 (six) hours as needed. For nausea      BP 133/92  Pulse 86  Temp(Src) 98.3 F (36.8 C) (Oral)  Resp 14  Ht 5\' 6"  (1.676 m)  Wt 188 lb (85.276 kg)  BMI 30.34 kg/m2  SpO2 100%  LMP 12/01/2011  Vital signs normal    Physical Exam  Nursing note and vitals reviewed. Constitutional: She is oriented to person, place, and time. She appears well-developed and well-nourished.  Non-toxic appearance. She does not appear ill. No distress.  HENT:  Head: Normocephalic and atraumatic.  Right Ear: External ear  normal.  Left Ear: External ear normal.  Nose: Nose normal. No mucosal edema or rhinorrhea.  Mouth/Throat: Mucous membranes are normal. No dental abscesses or uvula swelling.       Dry mucus membranes   Eyes: Conjunctivae and EOM are normal. Pupils are equal, round, and reactive to light.  Neck: Normal range of motion and full passive range of motion without pain. Neck supple.  Cardiovascular: Normal rate, regular rhythm and normal heart sounds.  Exam reveals no gallop and no friction rub.   No murmur heard. Pulmonary/Chest: Effort normal and breath sounds normal. No respiratory distress. She has no wheezes. She has no rhonchi. She has no rales. She exhibits no tenderness and no crepitus.  Abdominal: Soft. Normal appearance and bowel sounds are normal. She exhibits no distension. There is no tenderness. There is no rebound and no guarding.       colostome  Musculoskeletal: Normal range of motion. She exhibits no edema and no tenderness.       Moves all extremities well.   Neurological: She is alert and oriented to person, place, and time. She has normal strength. No cranial nerve deficit.  Skin: Skin is warm, dry and intact. No rash noted. No erythema. No pallor.  Psychiatric: She has a normal mood and affect. Her speech is normal and behavior is normal. Her mood appears not anxious.    ED Course  Procedures (including critical care time)   Medications  0.9 %  sodium chloride infusion (not administered)  dicyclomine (BENTYL) tablet 20 mg (not administered)  hydrOXYzine (ATARAX/VISTARIL) tablet 25 mg (not administered)  loperamide (IMODIUM) capsule 2-4 mg (not administered)  methocarbamol (ROBAXIN) tablet 500 mg (not administered)  naproxen (NAPROSYN) tablet 500 mg (not administered)  ondansetron (ZOFRAN-ODT) disintegrating tablet 4 mg (4 mg Oral Given 12/07/11 1428)  promethazine (PHENERGAN) 25 MG tablet (not administered)  sodium chloride 0.9 % bolus 1,000 mL (1000 mL Intravenous  Given 12/07/11 1445)  ondansetron (ZOFRAN) injection 4 mg (4 mg Intravenous Given 12/07/11 1445)  LORazepam (ATIVAN) injection 1 mg (1 mg Intravenous Given 12/07/11 1446)   Pt had IV started by IV team, small catheter so will take awhile to get her IV fluids.  Plan is to have ACT evaluation once her dehydration is resolved and her nausea and vomiting is controlled.   Results for orders placed during the hospital encounter of 12/07/11  CBC      Component Value Range   WBC 12.9 (*) 4.0 - 10.5 (K/uL)   RBC 5.17 (*) 3.87 - 5.11 (MIL/uL)   Hemoglobin 16.0 (*)  12.0 - 15.0 (g/dL)   HCT 16.1  09.6 - 04.5 (%)   MCV 88.0  78.0 - 100.0 (fL)   MCH 30.9  26.0 - 34.0 (pg)   MCHC 35.2  30.0 - 36.0 (g/dL)   RDW 40.9  81.1 - 91.4 (%)   Platelets 337  150 - 400 (K/uL)  COMPREHENSIVE METABOLIC PANEL      Component Value Range   Sodium 133 (*) 135 - 145 (mEq/L)   Potassium 5.7 (*) 3.5 - 5.1 (mEq/L)   Chloride 96  96 - 112 (mEq/L)   CO2 20  19 - 32 (mEq/L)   Glucose, Bld 92  70 - 99 (mg/dL)   BUN 13  6 - 23 (mg/dL)   Creatinine, Ser 7.82  0.50 - 1.10 (mg/dL)   Calcium 9.8  8.4 - 95.6 (mg/dL)   Total Protein 9.2 (*) 6.0 - 8.3 (g/dL)   Albumin 4.7  3.5 - 5.2 (g/dL)   AST 68 (*) 0 - 37 (U/L)   ALT 75 (*) 0 - 35 (U/L)   Alkaline Phosphatase 88  39 - 117 (U/L)   Total Bilirubin 0.7  0.3 - 1.2 (mg/dL)   GFR calc non Af Amer >90  >90 (mL/min)   GFR calc Af Amer >90  >90 (mL/min)  ETHANOL      Component Value Range   Alcohol, Ethyl (B) <11  0 - 11 (mg/dL)  ACETAMINOPHEN LEVEL      Component Value Range   Acetaminophen (Tylenol), Serum <15.0  10 - 30 (ug/mL)  URINE RAPID DRUG SCREEN (HOSP PERFORMED)      Component Value Range   Opiates NONE DETECTED  NONE DETECTED    Cocaine NONE DETECTED  NONE DETECTED    Benzodiazepines NONE DETECTED  NONE DETECTED    Amphetamines NONE DETECTED  NONE DETECTED    Tetrahydrocannabinol NONE DETECTED  NONE DETECTED    Barbiturates NONE DETECTED  NONE DETECTED   POCT  PREGNANCY, URINE      Component Value Range   Preg Test, Ur NEGATIVE  NEGATIVE    Laboratory interpretation all normal except leukocytosis, mild hyponatremia, hyperkalemia but this is patient with difficult blood draw) and elevation of LFTs, her Hb is concentrated from 14 c/w dehydration  Lab states blood was hemolyzed (accounts for high potassium).   Dg Abd Acute W/chest  12/01/2011  *RADIOLOGY REPORT*  Clinical Data: Right lower quadrant abdominal pain, nausea and vomiting.  History of Crohn's disease.  ACUTE ABDOMEN SERIES (ABDOMEN 2 VIEW & CHEST 1 VIEW)  Comparison: Previous examinations.  Findings: The heart remains normal in size and the lungs are clear. Normal bowel gas pattern without free peritoneal air.  Minimal scoliosis.  IMPRESSION: No acute abnormality.  Original Report Authenticated By: Darrol Angel, M.D.    Date: 12/07/2011  Rate: 66  Rhythm: normal sinus rhythm  QRS Axis: normal  Intervals: normal  ST/T Wave abnormalities: nonspecific T wave changes  Conduction Disutrbances:none  Narrative Interpretation:   Old EKG Reviewed: none available    1. Dehydration   2. Withdrawal from opioids   3. Depression     Plan psychiatric admission once dehydration and vomiting controlled.  Devoria Albe, MD, FACEP   MDM          Ward Givens, MD 12/07/11 [phone]

## 2011-12-08 ENCOUNTER — Inpatient Hospital Stay (HOSPITAL_COMMUNITY)
Admission: AD | Admit: 2011-12-08 | Discharge: 2011-12-14 | DRG: 897 | Disposition: A | Discharge status: Nursing Home (Includes ICF) | Payer: Federal, State, Local not specified - Other | Source: Ambulatory Visit | Attending: Psychiatry | Admitting: Psychiatry

## 2011-12-08 DIAGNOSIS — F329 Major depressive disorder, single episode, unspecified: Secondary | ICD-10-CM

## 2011-12-08 DIAGNOSIS — F1123 Opioid dependence with withdrawal: Secondary | ICD-10-CM | POA: Diagnosis present

## 2011-12-08 DIAGNOSIS — IMO0002 Reserved for concepts with insufficient information to code with codable children: Secondary | ICD-10-CM

## 2011-12-08 DIAGNOSIS — K5090 Crohn's disease, unspecified, without complications: Secondary | ICD-10-CM | POA: Diagnosis present

## 2011-12-08 DIAGNOSIS — F419 Anxiety disorder, unspecified: Secondary | ICD-10-CM

## 2011-12-08 DIAGNOSIS — F32A Depression, unspecified: Secondary | ICD-10-CM

## 2011-12-08 DIAGNOSIS — F39 Unspecified mood [affective] disorder: Secondary | ICD-10-CM | POA: Diagnosis present

## 2011-12-08 DIAGNOSIS — F1120 Opioid dependence, uncomplicated: Secondary | ICD-10-CM | POA: Diagnosis present

## 2011-12-08 DIAGNOSIS — G43909 Migraine, unspecified, not intractable, without status migrainosus: Secondary | ICD-10-CM | POA: Diagnosis present

## 2011-12-08 DIAGNOSIS — F1193 Opioid use, unspecified with withdrawal: Secondary | ICD-10-CM | POA: Diagnosis present

## 2011-12-08 DIAGNOSIS — K219 Gastro-esophageal reflux disease without esophagitis: Secondary | ICD-10-CM | POA: Diagnosis present

## 2011-12-08 DIAGNOSIS — Z79899 Other long term (current) drug therapy: Secondary | ICD-10-CM

## 2011-12-08 DIAGNOSIS — F19939 Other psychoactive substance use, unspecified with withdrawal, unspecified: Principal | ICD-10-CM | POA: Diagnosis present

## 2011-12-08 HISTORY — DX: Migraine, unspecified, not intractable, without status migrainosus: G43.909

## 2011-12-08 MED ORDER — TRAZODONE HCL 50 MG PO TABS: 50.0000 mg | ORAL_TABLET | Freq: Every day | ORAL | Status: DC

## 2011-12-08 MED ORDER — LOPERAMIDE HCL 2 MG PO CAPS: 2.0000 mg | ORAL_CAPSULE | ORAL | Status: AC | PRN

## 2011-12-08 MED ORDER — VITAMIN B-1 100 MG PO TABS
100.0000 mg | ORAL_TABLET | Freq: Every day | ORAL | Status: DC
  Administered 2011-12-09 – 2011-12-13 (×5): 100 mg via ORAL
  Filled 2011-12-08 (×7): qty 1

## 2011-12-08 MED ORDER — HYDROXYZINE HCL 25 MG PO TABS: 25.0000 mg | ORAL_TABLET | Freq: Four times a day (QID) | ORAL | Status: DC | PRN

## 2011-12-08 MED ORDER — CLONAZEPAM 0.5 MG PO TABS
1.0000 mg | ORAL_TABLET | Freq: Two times a day (BID) | ORAL | Status: DC
  Administered 2011-12-08: 1 mg via ORAL
  Filled 2011-12-08: qty 2

## 2011-12-08 MED ORDER — MAGNESIUM HYDROXIDE 400 MG/5ML PO SUSP: 30.0000 mL | Freq: Every day | ORAL | Status: DC | PRN

## 2011-12-08 MED ORDER — DICYCLOMINE HCL 20 MG PO TABS
20.0000 mg | ORAL_TABLET | Freq: Four times a day (QID) | ORAL | Status: AC | PRN
  Administered 2011-12-10: 20 mg via ORAL
  Filled 2011-12-08: qty 1

## 2011-12-08 MED ORDER — CLONIDINE HCL 0.1 MG PO TABS
0.1000 mg | ORAL_TABLET | ORAL | Status: AC
  Administered 2011-12-11 – 2011-12-13 (×4): 0.1 mg via ORAL
  Filled 2011-12-08 (×4): qty 1

## 2011-12-08 MED ORDER — THIAMINE HCL 100 MG/ML IJ SOLN: 100.0000 mg | Freq: Once | INTRAMUSCULAR | Status: DC

## 2011-12-08 MED ORDER — ONDANSETRON 4 MG PO TBDP: 4.0000 mg | ORAL_TABLET | Freq: Four times a day (QID) | ORAL | Status: DC | PRN

## 2011-12-08 MED ORDER — LORAZEPAM 1 MG PO TABS
ORAL_TABLET | ORAL | Status: AC
  Filled 2011-12-08: qty 1

## 2011-12-08 MED ORDER — LORAZEPAM 1 MG PO TABS
1.0000 mg | ORAL_TABLET | Freq: Once | ORAL | Status: AC
  Administered 2011-12-08: 1 mg via ORAL
  Filled 2011-12-08: qty 1

## 2011-12-08 MED ORDER — CHLORDIAZEPOXIDE HCL 25 MG PO CAPS
25.0000 mg | ORAL_CAPSULE | Freq: Four times a day (QID) | ORAL | Status: AC | PRN
  Administered 2011-12-09: 25 mg via ORAL
  Filled 2011-12-08: qty 1

## 2011-12-08 MED ORDER — CLONIDINE HCL 0.1 MG PO TABS
0.1000 mg | ORAL_TABLET | Freq: Every day | ORAL | Status: DC
  Administered 2011-12-14: 0.1 mg via ORAL
  Filled 2011-12-08 (×2): qty 1

## 2011-12-08 MED ORDER — ONDANSETRON 4 MG PO TBDP: 4.0000 mg | ORAL_TABLET | Freq: Four times a day (QID) | ORAL | Status: AC | PRN

## 2011-12-08 MED ORDER — CHLORDIAZEPOXIDE HCL 25 MG PO CAPS
50.0000 mg | ORAL_CAPSULE | Freq: Once | ORAL | Status: AC
  Administered 2011-12-08: 50 mg via ORAL
  Filled 2011-12-08: qty 2

## 2011-12-08 MED ORDER — LORAZEPAM 1 MG PO TABS
1.0000 mg | ORAL_TABLET | Freq: Once | ORAL | Status: AC
  Administered 2011-12-08: 1 mg via ORAL

## 2011-12-08 MED ORDER — METHOCARBAMOL 500 MG PO TABS
500.0000 mg | ORAL_TABLET | Freq: Three times a day (TID) | ORAL | Status: AC | PRN
  Administered 2011-12-09 – 2011-12-12 (×5): 500 mg via ORAL
  Filled 2011-12-08 (×6): qty 1

## 2011-12-08 MED ORDER — TRAZODONE HCL 50 MG PO TABS
50.0000 mg | ORAL_TABLET | Freq: Every evening | ORAL | Status: DC | PRN
  Administered 2011-12-08 – 2011-12-11 (×7): 50 mg via ORAL
  Filled 2011-12-08 (×15): qty 1

## 2011-12-08 MED ORDER — CLONIDINE HCL 0.1 MG PO TABS
0.1000 mg | ORAL_TABLET | Freq: Four times a day (QID) | ORAL | Status: AC
  Administered 2011-12-08 – 2011-12-11 (×8): 0.1 mg via ORAL
  Filled 2011-12-08 (×11): qty 1

## 2011-12-08 MED ORDER — LOPERAMIDE HCL 2 MG PO CAPS: 2.0000 mg | ORAL_CAPSULE | ORAL | Status: DC | PRN

## 2011-12-08 MED ORDER — DULOXETINE HCL 60 MG PO CPEP
60.0000 mg | ORAL_CAPSULE | Freq: Every day | ORAL | Status: DC
  Administered 2011-12-08: 60 mg via ORAL
  Filled 2011-12-08: qty 1

## 2011-12-08 MED ORDER — ALUM & MAG HYDROXIDE-SIMETH 200-200-20 MG/5ML PO SUSP: 30.0000 mL | ORAL | Status: DC | PRN

## 2011-12-08 MED ORDER — HYDROXYZINE HCL 25 MG PO TABS
25.0000 mg | ORAL_TABLET | Freq: Four times a day (QID) | ORAL | Status: DC | PRN
  Administered 2011-12-09: 25 mg via ORAL

## 2011-12-08 MED ORDER — ADULT MULTIVITAMIN W/MINERALS CH
1.0000 | ORAL_TABLET | Freq: Every day | ORAL | Status: DC
  Administered 2011-12-09 – 2011-12-10 (×2): 1 via ORAL
  Administered 2011-12-11: 08:00:00 via ORAL
  Administered 2011-12-12 – 2011-12-13 (×2): 1 via ORAL
  Filled 2011-12-08 (×7): qty 1

## 2011-12-08 MED ORDER — NAPROXEN 500 MG PO TABS: 500.0000 mg | ORAL_TABLET | Freq: Two times a day (BID) | ORAL | Status: AC | PRN

## 2011-12-08 MED ORDER — ACETAMINOPHEN 325 MG PO TABS
650.0000 mg | ORAL_TABLET | Freq: Four times a day (QID) | ORAL | Status: DC | PRN
  Administered 2011-12-13: 650 mg via ORAL

## 2011-12-08 NOTE — ED Provider Notes (Signed)
Dr Maryellen Pile states she can come to [facility] to [address] floor if no other issues for being declined such as insurance, states to contact ACT.    14:20 Suzette Battiest will call to [facility] to see why she was declined.   Ward Givens, MD 12/08/11 [phone]

## 2011-12-08 NOTE — [facility] Counselor (Signed)
Pt accepted [facility] to [facility].

## 2011-12-08 NOTE — ED Provider Notes (Signed)
Stephanie [name], ACT states accepted at [facility].  Ward Givens, MD 12/08/11 [phone]

## 2011-12-08 NOTE — ED Notes (Addendum)
Tele-psych evaluation in progress.

## 2011-12-08 NOTE — ED Notes (Signed)
Psych evaluation form faxed over and eval initiated via phone.

## 2011-12-08 NOTE — ED Notes (Signed)
Security called to take pt to [facility], currently taking a pt from ED to [facility], will call back to take pt

## 2011-12-08 NOTE — B[facility] Counselor (Signed)
Pt has been declined at [facility] by Dr. Elmon Kirschner due to not meeting criteria for inpatient at this time.

## 2011-12-08 NOTE — ED Notes (Signed)
Report given to [place], Rn at [facility].  Requested to wait until 1830 to send patient.

## 2011-12-08 NOTE — Tx Team (Signed)
Initial Interdisciplinary Treatment Plan  PATIENT STRENGTHS: (choose at least two) Ability for insight Average or above average intelligence Capable of independent living Communication skills General fund of knowledge Motivation for treatment/growth  PATIENT STRESSORS: Financial difficulties Health problems   PROBLEM LIST: Problem List/Patient Goals Date to be addressed Date deferred Reason deferred Estimated date of resolution                                                         DISCHARGE CRITERIA:  Ability to meet basic life and health needs Improved stabilization in mood, thinking, and/or behavior  PRELIMINARY DISCHARGE PLAN: Attend aftercare/continuing care group Outpatient therapy  PATIENT/FAMIILY INVOLVEMENT: This treatment plan has been presented to and reviewed with the patient, Stephanie [name], and/or family member, .  The patient and family have been given the opportunity to ask questions and make suggestions.  Stephanie [name] 12/08/2011, 8:34 PM

## 2011-12-08 NOTE — Progress Notes (Signed)
Patient cooperative but anxious upon admission. Denies SI/HI, denies A/V hallucinations. Patient verbalizes "I have not been sleeping since last Thursday." Patient oriented to unit, oriented to policies and procedures, verbalizes understanding. Patient resting quietly in room. Q15 minute checks for safety. Will continue to monitor.

## 2011-12-08 NOTE — ED Notes (Signed)
Pt c/o anxiety; MD aware, new orders obtained.

## 2011-12-08 NOTE — ED Provider Notes (Signed)
Telepsych done by Dr Berlin Hun who recommends inpatient detox/rehab. She also recommends klonopin 1 mg BID, Cymbalta 60 mg daily and trazadone 50 mg qhs.   Ward Givens, MD 12/08/11 1047

## 2011-12-08 NOTE — ED Provider Notes (Signed)
PT seen by me yesterday for detox from narcotics, benzo's, adderal and her psych meds. Pt was dehydrated with concentrated Hb and uncontrolled nausea and watery diarrhea. Has had IV fluids, is feeling better today. Per night shift ACT states not meeting criteria for admission, pt wants admission. Will do telepsych evaluation.  Devoria Albe, MD, FACEP   Ward Givens, MD 12/08/11 [phone]

## 2011-12-09 ENCOUNTER — Encounter (HOSPITAL_COMMUNITY): Payer: Self-pay | Admitting: Physician Assistant

## 2011-12-09 MED ORDER — DULOXETINE HCL 60 MG PO CPEP
60.0000 mg | ORAL_CAPSULE | Freq: Every day | ORAL | Status: DC
  Administered 2011-12-09 – 2011-12-13 (×5): 60 mg via ORAL
  Filled 2011-12-09: qty 14
  Filled 2011-12-09 (×8): qty 1

## 2011-12-09 MED ORDER — RISPERIDONE 0.5 MG PO TABS
0.5000 mg | ORAL_TABLET | Freq: Every day | ORAL | Status: DC
  Administered 2011-12-09 – 2011-12-11 (×3): 0.5 mg via ORAL
  Filled 2011-12-09 (×5): qty 1

## 2011-12-09 MED ORDER — RISPERIDONE 0.25 MG PO TABS
0.2500 mg | ORAL_TABLET | Freq: Every day | ORAL | Status: DC | PRN
  Administered 2011-12-09: 0.25 mg via ORAL
  Filled 2011-12-09: qty 1

## 2011-12-09 NOTE — Progress Notes (Signed)
Pt did not attend d/c planning group on this date.  SW met with pt individually at this time.  Pt presents with calm mood and affect.  Pt was open with sharing reason for entering the hospital.  Pt states that she has Chron's Disease and has been on pain meds for 15-21 years, prescribed.  Pt states that she wants to get off of these meds to be clearer and learn to manage the pain on her own.  Pt states that she lives in [place] with her parents.  Pt states that she wants a 30 day rehab.  SW discussed treatment options and pt agreed to be referred to [facility].  Pt is scheduled to go there on Wednesday.  Pt reports having transportation there.  Pt ranks depression and anxiety at a 7 today.  Pt denies SI.  No further needs voiced by pt at this time.    Stephanie [name], LCSWA 12/09/2011  2:00 PM

## 2011-12-09 NOTE — Progress Notes (Signed)
[facility] Adult Inpatient Family/Significant Other Suicide Prevention Education  Suicide Prevention Education:  Education Completed; Nanami Whitelaw at [phone], mother, has been identified by the patient as the family member/significant other with whom the patient will be residing, and identified as the person(s) who will aid the patient in the event of a mental health crisis (suicidal ideations/suicide attempt).  With written consent from the patient, the family member/significant other has been provided the following suicide prevention education, prior to the and/or following the discharge of the patient.  The suicide prevention education provided includes the following:  Suicide risk factors  Suicide prevention and interventions  National Suicide Hotline telephone number  [facility] assessment telephone number  [facility] Emergency Assistance 911  [facility] and/or Residential Mobile Crisis Unit telephone number  Request made of family/significant other to:  Remove weapons (e.g., guns, rifles, knives), all items previously/currently identified as safety concern.    Remove drugs/medications (over-the-counter, prescriptions, illicit drugs), all items previously/currently identified as a safety concern.  Mother agrees to secure medications and come up with plan with daughter for administrating medications and reports the firearms in home are secured  The family member/significant other verbalizes understanding of the suicide prevention education information provided.  The family member/significant other agrees to remove the items of safety concern listed above.  [name] 12/09/2011, 3:58 PM

## 2011-12-09 NOTE — Progress Notes (Signed)
Self care of colostomy.  Instructed to call for assistance with care, if needed.

## 2011-12-09 NOTE — Progress Notes (Signed)
[facility] Group Notes:  (Counselor/Nursing/MHT/Case Management/Adjunct)  12/09/2011 4:17 PM  Type of Therapy:  Group Therapy at 11:00  Participation Level:  Active  Participation Quality:  Appropriate  Affect:  Appropriate  Cognitive:  Alert and Oriented  Insight:  Limited  Engagement in Group:  Good  Engagement in Therapy:  Limited  Modes of Intervention:  Education, Socialization and Support  Summary of Progress/Problems:  Stephanie [name] attended first group session of inpatient stay and willingly  shared with group  What brought her into hospital. She was also attentive to educational presentation and discussion on Post Acute Withdrawal Syndrome (PAWS).   [name] 12/09/2011, 4:17 PM  [facility] Group Notes:  (Counselor/Nursing/MHT/Case Management/Adjunct)  12/09/2011 4:24 PM  Type of Therapy:  Group Therapy  Participation Level:  Active  Participation Quality:  Attentive and Sharing  Affect:  Appropriate  Cognitive:  Alert and Oriented  Insight:  Limited  Engagement in Group:  Good  Engagement in Therapy:  Good  Modes of Intervention:  Clarification and Support  Summary of Progress/Problems:  Stephanie [name] shared that she doesn't know quite what to expect with this recovery journey but she is believing that things could not get worse.  Stephanie [name] also shared about confussion as to whether I am just overmedicated or am addicted.  Others in group encouraged her to remain open.    [name] 12/09/2011, 4:24 PM

## 2011-12-09 NOTE — Progress Notes (Signed)
Lying quietly in bed with eyes closed.  Skin warm and dry to touch.  No objective signs of acute withdrawal.  Q 15 minute safety checks in progress.

## 2011-12-09 NOTE — Progress Notes (Signed)
Davanna He at [phone] mother reports concern regarding patient's recent decision to stop working, followed by depression and decrease in personal hygiene.  Writer requested case manager contact patient's mother and discuss discharge referral options for further treatments.  [name] 12/09/2011

## 2011-12-09 NOTE — Progress Notes (Signed)
Pt is quiet and appropriate this shift. C/O agitation and generalized pain r/t withdrawal. Compliant with scheduled protocol.  Denies SI.  Rates depression and hopelessness at 5-6. Fluids given.  Reports poor sleep and improving appetite.  Planning on a long term treatment option. Support and encouragement given. Continue current POC and 15' checks for safety.
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
## 2011-12-09 NOTE — [facility] Suicide Risk Assessment (Signed)
Suicide Risk Assessment  Admission Assessment      Demographic factors:  See chart.  Current Mental Status: Suicidal ideation indicated by patient prior to admission.  Patient seen and evaluated. Chart reviewed. Patient stated that her mood was "tired". Her affect was mood congruent and constricted. She denied any current thoughts of self injurious behavior, suicidal ideation or homicidal ideation. There were no auditory or visual hallucinations, paranoia, delusional thought processes, or mania noted.  Thought process was linear and goal directed.  No psychomotor agitation or retardation was noted. Speech was normal rate, tone and volume. Eye contact was good. Judgment and insight are fair.  C/o mood instability and racing thoughts, yet in context of coming off opioids and benzos.  Sig MedHx.  Loss Factors:  Decrease in vocational status;Loss of significant relationship;Decline in physical health;Financial problems / change in socioeconomic status  Historical Factors:  Family history of mental illness or substance abuse; SI prior to admission  Risk Reduction Factors:  Sense of responsibility to family;Living with another person, especially a relative;Positive social support;Positive therapeutic relationship  CLINICAL FACTORS: Opioid W/D; Mood Disorder NOS  COGNITIVE FEATURES THAT CONTRIBUTE TO RISK: limited insight; impulsivity.  SUICIDE RISK: Pt viewed as a chronic increased risk of harm to self in light of her past hx and risk factors.  No acute safety concerns on the unit.  Pt contracting for safety and in need of crisis stabilization & Tx.  PLAN OF CARE: Pt admitted for crisis stabilization, further requested detox and treatment.  Please see orders per Jorje Guild, PA. Medications reviewed with pt and medication education provided.  Will continue q15 minute checks per unit protocol.  No clinical indication for one on one level of observation at this time.  Pt contracting for safety.  Mental  health treatment, medication management and continued sobriety will mitigate against the increased risk of harm to self and/or others.  Discussed the importance of recovery with pt, as well as, tools to move forward in a healthy & safe manner.  Pt agreeable with the plan.  Discussed with the team. Open to residential Tx s/p detox if needed.  Lupe Carney 12/09/2011, 3:13 PM

## 2011-12-10 DIAGNOSIS — F329 Major depressive disorder, single episode, unspecified: Secondary | ICD-10-CM

## 2011-12-10 DIAGNOSIS — F3289 Other specified depressive episodes: Secondary | ICD-10-CM

## 2011-12-10 DIAGNOSIS — F411 Generalized anxiety disorder: Secondary | ICD-10-CM

## 2011-12-10 MED ORDER — RISPERIDONE 0.25 MG PO TABS
0.2500 mg | ORAL_TABLET | Freq: Every day | ORAL | Status: DC | PRN
  Administered 2011-12-10 – 2011-12-12 (×3): 0.25 mg via ORAL
  Filled 2011-12-10 (×3): qty 1

## 2011-12-10 MED ORDER — TUBERCULIN PPD 5 UNIT/0.1ML ID SOLN
5.0000 [IU] | Freq: Once | INTRADERMAL | Status: AC
  Administered 2011-12-10: 5 [IU] via INTRADERMAL

## 2011-12-10 NOTE — Progress Notes (Signed)
Patient ID: Stephanie [name], female   DOB: 1970/12/07, 41 y.o.   MRN: [id number]  Pt. attended and participated in aftercare planning group. Pt. verbally accepted information on suicide prevention, warning signs to look for with suicide and crisis line numbers to use. Pt. listed their current anxiety level as 5 and their current depression level as low. Pt. shared that she feels anxious because this treatment seems new to her and she will not know what to do at discharge when she is in pain.

## 2011-12-10 NOTE — Progress Notes (Signed)
Pt has been calm and cooperative this evening. Attending groups and taking meds as scheduled. Tonight pt reports anxiety, restlessness, and leg pain. Symptoms managed with scheduled pharmacological regimen and Robaxin. Pt is vested in her treatment. Pt appears motivated to stay free from opiate use for pain management. Continued support and availability as needed has been extended to this pt. Pt safety remains with q49min checks.

## 2011-12-10 NOTE — Progress Notes (Signed)
Pt stated that she did not feel the need for 1700 clonidine. Reminded that she had backup prn medications if needed.

## 2011-12-10 NOTE — Progress Notes (Signed)
Pt is out in milieu interacting with peers and attending groups with good participation.  Rates depression at 2-3 and hopelessness at 1.  Denies SI. C/O anxiety and stomach cramps r/t withdrawal and prn medications requested and received.  Is proactive with treatment and continues to desire long term treatment. Support and encouragement given. Continue current POC and cont 15' checks for safety.

## 2011-12-10 NOTE — Progress Notes (Signed)
[facility] MD Progress Note  12/10/2011 12:08 PM  Diagnosis:   Axis I: Anxiety Disorder NOS, Depressive Disorder NOS, Rule out Substance dependence and Substance Induced Mood Disorder Axis II: Deferred Axis III:  Past Medical History  Diagnosis Date  . Crohn's disease   . GERD (gastroesophageal reflux disease)   . Abdominal pain   . Anxiety   . Migraine headache    Subjective: And stop this provider in the hall to ask why she was unable to get her Risperdal today. She felt like it was helpful for her yesterday in decreasing her racing thoughts and rumination. She reports she had some trouble falling asleep last night, but with extra Robaxin and trazodone she did go to sleep and slept well. She feels that she is progressing well, and denies any withdrawal symptoms. Her appetite is fair. She does have some abdominal pain, possibly associated with her Crohn's disease, but also possibly an opiate withdrawal symptoms. He denies any suicidal or homicidal ideation. She denies any auditory or visual hallucinations. She does endorse some mild depression. She continues to express a desire to go to rehabilitation after discharge.  ADL's:  Intact  Sleep: Fair  Appetite:  Fair  Suicidal Ideation:  Patient denies any thought, plan, or intent Homicidal Ideation:  Denies any thought, plan, or intent  AEB (as evidenced by):  Mental Status Examination/Evaluation: Objective:  Appearance: Neat and Well Groomed  Eye Contact::  Good  Speech:  Clear and Coherent  Volume:  Normal  Mood:  Euthymic  Affect:  Congruent  Thought Process:  Goal Directed and Linear  Orientation:  Full  Thought Content:  WDL  Suicidal Thoughts:  No  Homicidal Thoughts:  No  Memory:  Immediate;   Good Recent;   Good Remote;   Good  Judgement:  Good  Insight:  Good  Psychomotor Activity:  Normal  Concentration:  Good  Recall:  Good  Akathisia:  No  Handed:    AIMS (if indicated):     Assets:  Communication Skills Desire  for Improvement Social Support  Sleep:  Number of Hours: 5.75    Vital Signs:Blood pressure 101/71, pulse 102, temperature 98.2 F (36.8 C), temperature source Oral, resp. rate 16, height 5\' 6"  (1.676 m), weight 78.472 kg (173 lb), last menstrual period 12/01/2011. Current Medications: Current Facility-Administered Medications  Medication Dose Route Frequency Provider Last Rate Last Dose  . acetaminophen (TYLENOL) tablet 650 mg  650 mg Oral Q6H PRN Mickie D. Adams, PA      . alum & mag hydroxide-simeth (MAALOX/MYLANTA) 200-200-20 MG/5ML suspension 30 mL  30 mL Oral Q4H PRN Mickie D. Adams, PA      . chlordiazePOXIDE (LIBRIUM) capsule 25 mg  25 mg Oral Q6H PRN Mickie D. Adams, PA   25 mg at 12/09/11 1609  . cloNIDine (CATAPRES) tablet 0.1 mg  0.1 mg Oral QID Mickie D. Adams, PA   0.1 mg at 12/10/11 1158   Followed by  . cloNIDine (CATAPRES) tablet 0.1 mg  0.1 mg Oral BH-qamhs Mickie D. Adams, PA       Followed by  . cloNIDine (CATAPRES) tablet 0.1 mg  0.1 mg Oral QAC breakfast Mickie D. Adams, PA      . dicyclomine (BENTYL) tablet 20 mg  20 mg Oral Q6H PRN Mickie D. Pernell Dupre, PA      . DULoxetine (CYMBALTA) DR capsule 60 mg  60 mg Oral Daily Jorje Guild, PA-C   60 mg at 12/10/11 0809  . loperamide (  IMODIUM) capsule 2-4 mg  2-4 mg Oral PRN Mickie D. Adams, PA      . magnesium hydroxide (MILK OF MAGNESIA) suspension 30 mL  30 mL Oral Daily PRN Mickie D. Adams, PA      . methocarbamol (ROBAXIN) tablet 500 mg  500 mg Oral Q8H PRN Mickie D. Adams, PA   500 mg at 12/09/11 2346  . multivitamin with minerals tablet 1 tablet  1 tablet Oral Daily Mickie D. Adams, PA   1 tablet at 12/10/11 0809  . naproxen (NAPROSYN) tablet 500 mg  500 mg Oral BID PRN Mickie D. Adams, PA      . ondansetron (ZOFRAN-ODT) disintegrating tablet 4 mg  4 mg Oral Q6H PRN Mickie D. Adams, PA      . risperiDONE (RISPERDAL) tablet 0.25 mg  0.25 mg Oral Daily PRN Jorje Guild, PA-C      . risperiDONE (RISPERDAL) tablet 0.5 mg  0.5 mg  Oral QHS Jorje Guild, PA-C   0.5 mg at 12/09/11 2150  . thiamine (B-1) injection 100 mg  100 mg Intramuscular Once Mickie D. Adams, PA      . thiamine (VITAMIN B-1) tablet 100 mg  100 mg Oral Daily Mickie D. Adams, PA   100 mg at 12/10/11 0809  . traZODone (DESYREL) tablet 50 mg  50 mg Oral QHS,MR X 1 Mickie D. Adams, PA   50 mg at 12/09/11 2347  . DISCONTD: hydrOXYzine (ATARAX/VISTARIL) tablet 25 mg  25 mg Oral Q6H PRN Mickie D. Adams, PA      . DISCONTD: hydrOXYzine (ATARAX/VISTARIL) tablet 25 mg  25 mg Oral Q6H PRN Mickie D. Adams, PA   25 mg at 12/09/11 0453  . DISCONTD: loperamide (IMODIUM) capsule 2-4 mg  2-4 mg Oral PRN Mickie D. Adams, PA      . DISCONTD: ondansetron (ZOFRAN-ODT) disintegrating tablet 4 mg  4 mg Oral Q6H PRN Mickie D. Adams, PA      . DISCONTD: risperiDONE (RISPERDAL) tablet 0.25 mg  0.25 mg Oral Daily PRN Jorje Guild, PA-C   0.25 mg at 12/09/11 1032    Lab Results: No results found for this or any previous visit (from the past 48 hour(s)).  Physical Findings: AIMS:  , ,  ,  ,    CIWA:  CIWA-Ar Total: 0  COWS:  COWS Total Score: 5   Treatment Plan Summary: Daily contact with patient to assess and evaluate symptoms and progress in treatment Medication management  Plan: We will resume Risperdal 0.25 mg to be taken daily as needed. You her detox protocol and followup plans.  Kaede Clendenen 12/10/2011, 12:08 PM

## 2011-12-10 NOTE — Progress Notes (Signed)
[facility] Group Notes:  (Counselor/Nursing/MHT/Case Management/Adjunct)  12/10/2011 1:15 PM  Type of Therapy:  Group Therapy, Dance/Movement Therapy   Participation Level:  Minimal  Participation Quality:  Attentive, Sharing and Supportive  Affect:  Appropriate  Cognitive:  Appropriate  Insight:  Limited  Engagement in Group:  Limited  Engagement in Therapy:  Limited  Modes of Intervention:  Clarification, Problem-solving, Role-play, Socialization and Support  Summary of Progress/Problems: Pt participated in a group discussion where excuses and "sabotaging statements" where shared so as to identify them as such and to bring awareness to the unhealthy statements that are said that help justify use. Pt spoke about how to make changes in their lives so as to change these statements and have support in recovery. Pt spoke specifically about knowing that she needs to want to be clean and how today she is realizing that she was using the pain medications as a "cruch" and truly did not need them.    [name]

## 2011-12-11 NOTE — Progress Notes (Signed)
Pt. attended and participated in aftercare planning group.  Pt. listed their current anxiety level as a 3 and their depression as a 1. Pt spoke about some frustrations with her medications.

## 2011-12-11 NOTE — Progress Notes (Signed)
[facility] MD Progress Note  12/11/2011 11:46 AM  Diagnosis:   Axis I: Anxiety Disorder NOS, Depressive Disorder NOS, Rule out Substance dependence and Substance Induced Mood Disorder Axis II: Deferred Axis III:  Past Medical History  Diagnosis Date  . Crohn's disease   . GERD (gastroesophageal reflux disease)   . Abdominal pain   . Anxiety   . Migraine headache    Subjective: And feels that she is making progress. Other than some restless leg symptoms she denies withdrawal symptoms. She does complain that her appetite is decreased. Her sleep is affected by her inability to get comfortable in bed. She feels that her anxiety is resolving, and she feels less depressed. She reports that she does have some suicidal ideation with a plan to overdose, but states "I couldn't do that to my family."  She denies any homicidal ideation. She denies any auditory or visual hallucinations.  ADL's:  Intact  Sleep: Fair  Appetite:  Fair  Suicidal Ideation:  Mild suicidal ideation with thoughts of overdosing, but denies intent Homicidal Ideation:  Patient denies thought, plan, or intent  AEB (as evidenced by):  Mental Status Examination/Evaluation: Objective:  Appearance: Neat and Well Groomed  Eye Contact::  Good  Speech:  Clear and Coherent  Volume:  Normal  Mood:  Euthymic  Affect:  Appropriate  Thought Process:  Linear  Orientation:  Full  Thought Content:  WDL  Suicidal Thoughts:  Yes.  without intent/plan  Homicidal Thoughts:  No  Memory:  Immediate;   Good Recent;   Good Remote;   Good  Judgement:  Good  Insight:  Good  Psychomotor Activity:  Normal  Concentration:  Good  Recall:  Good  Akathisia:  No  Handed:    AIMS (if indicated):     Assets:  Communication Skills Desire for Improvement Housing  Sleep:  Number of Hours: 6.5    Vital Signs:Blood pressure 119/84, pulse 101, temperature 98 F (36.7 C), temperature source Oral, resp. rate 16, height 5\' 6"  (1.676 m), weight  78.472 kg (173 lb), last menstrual period 12/01/2011. Current Medications: Current Facility-Administered Medications  Medication Dose Route Frequency Provider Last Rate Last Dose  . acetaminophen (TYLENOL) tablet 650 mg  650 mg Oral Q6H PRN Mickie D. Adams, PA      . alum & mag hydroxide-simeth (MAALOX/MYLANTA) 200-200-20 MG/5ML suspension 30 mL  30 mL Oral Q4H PRN Mickie D. Adams, PA      . chlordiazePOXIDE (LIBRIUM) capsule 25 mg  25 mg Oral Q6H PRN Mickie D. Adams, PA   25 mg at 12/09/11 1609  . cloNIDine (CATAPRES) tablet 0.1 mg  0.1 mg Oral QID Mickie D. Adams, PA   0.1 mg at 12/11/11 0755   Followed by  . cloNIDine (CATAPRES) tablet 0.1 mg  0.1 mg Oral BH-qamhs Mickie D. Adams, PA       Followed by  . cloNIDine (CATAPRES) tablet 0.1 mg  0.1 mg Oral QAC breakfast Mickie D. Adams, PA      . dicyclomine (BENTYL) tablet 20 mg  20 mg Oral Q6H PRN Mickie D. Adams, PA   20 mg at 12/10/11 1314  . DULoxetine (CYMBALTA) DR capsule 60 mg  60 mg Oral Daily Jorje Guild, PA-C   60 mg at 12/11/11 0755  . loperamide (IMODIUM) capsule 2-4 mg  2-4 mg Oral PRN Mickie D. Adams, PA      . magnesium hydroxide (MILK OF MAGNESIA) suspension 30 mL  30 mL Oral Daily PRN Mickie D.  Adams, PA      . methocarbamol (ROBAXIN) tablet 500 mg  500 mg Oral Q8H PRN Mickie D. Adams, PA   500 mg at 12/10/11 2248  . multivitamin with minerals tablet 1 tablet  1 tablet Oral Daily Mickie D. Adams, PA      . naproxen (NAPROSYN) tablet 500 mg  500 mg Oral BID PRN Mickie D. Adams, PA      . ondansetron (ZOFRAN-ODT) disintegrating tablet 4 mg  4 mg Oral Q6H PRN Mickie D. Adams, PA      . risperiDONE (RISPERDAL) tablet 0.25 mg  0.25 mg Oral Daily PRN Jorje Guild, PA-C   0.25 mg at 12/10/11 1313  . risperiDONE (RISPERDAL) tablet 0.5 mg  0.5 mg Oral QHS Jorje Guild, PA-C   0.5 mg at 12/10/11 2136  . thiamine (B-1) injection 100 mg  100 mg Intramuscular Once Mickie D. Adams, PA      . thiamine (VITAMIN B-1) tablet 100 mg  100 mg Oral Daily  Mickie D. Adams, PA   100 mg at 12/11/11 0755  . traZODone (DESYREL) tablet 50 mg  50 mg Oral QHS,MR X 1 Mickie D. Adams, PA   50 mg at 12/10/11 2247  . tuberculin injection 5 Units  5 Units Intradermal Once Jorje Guild, PA-C   5 Units at 12/10/11 1441    Lab Results: No results found for this or any previous visit (from the past 48 hour(s)).  Physical Findings: AIMS:  , ,  ,  ,    CIWA:  CIWA-Ar Total: 0  COWS:  COWS Total Score: 4   Treatment Plan Summary: Daily contact with patient to assess and evaluate symptoms and progress in treatment Medication management  Plan: We will continue her current plan of care, and research treatment facility options.  Stephanie [name] 12/11/2011, 11:46 AM

## 2011-12-11 NOTE — Progress Notes (Signed)
Pt denies SI/HI/AVH. Pt rates her depression 2, hopelessness 0, and anxiety 3. Pt states that she is doing well and has no complaints. Support and encouragement offered. Pt receptive.

## 2011-12-11 NOTE — Progress Notes (Signed)
[facility] Group Notes:  (Counselor/Nursing/MHT/Case Management/Adjunct)  12/11/2011 1:15 PM  Type of Therapy:  Group Therapy, Dance/Movement Therapy   Participation Level:  Active  Participation Quality:  Appropriate, Attentive, Sharing and Supportive  Affect:  Appropriate  Cognitive:  Appropriate  Insight:  Good  Engagement in Group:  Limited  Engagement in Therapy:  Limited  Modes of Intervention:  Clarification, Problem-solving, Role-play, Socialization and Support  Summary of Progress/Problems: pt participated in a group discussion on supports. Pt spoke about how to use supports and how to find positive supports. Pt also discussed addiction and how to mange, cope and contain triggers once D/C. Pt spoke about knowing she needed to get off the medications and stop "living her life in a fog".

## 2011-12-11 NOTE — Progress Notes (Signed)
Pt out in milieu interacting with peers and attending groups.  Is very proactive with her recovery process. Rates depression at 2 and did not rate hopelessness.  Continues to c/u agitation and anxiety r/t withdrawal but behavior is appropriate.  Denies SI. Reports poor sleep and instructed to inform provider. Support offered. Continue current POC and cont 15' checks for safety.

## 2011-12-12 DIAGNOSIS — F1120 Opioid dependence, uncomplicated: Secondary | ICD-10-CM

## 2011-12-12 DIAGNOSIS — F19939 Other psychoactive substance use, unspecified with withdrawal, unspecified: Principal | ICD-10-CM

## 2011-12-12 MED ORDER — RISPERIDONE 0.5 MG PO TABS
0.5000 mg | ORAL_TABLET | Freq: Every day | ORAL | Status: DC
  Administered 2011-12-12 – 2011-12-13 (×2): 0.5 mg via ORAL
  Filled 2011-12-12 (×3): qty 1

## 2011-12-12 MED ORDER — RISPERIDONE 0.5 MG PO TABS
0.2500 mg | ORAL_TABLET | Freq: Two times a day (BID) | ORAL | Status: DC
  Administered 2011-12-13: 0.25 mg via ORAL
  Filled 2011-12-12 (×3): qty 1
  Filled 2011-12-12: qty 28
  Filled 2011-12-12 (×3): qty 1
  Filled 2011-12-12: qty 28

## 2011-12-12 MED ORDER — RISPERIDONE 1 MG PO TABS
1.0000 mg | ORAL_TABLET | Freq: Every day | ORAL | Status: DC
  Filled 2011-12-12: qty 1

## 2011-12-12 MED ORDER — TRAZODONE HCL 100 MG PO TABS
100.0000 mg | ORAL_TABLET | Freq: Every evening | ORAL | Status: DC | PRN
  Administered 2011-12-12 – 2011-12-13 (×2): 100 mg via ORAL
  Filled 2011-12-12: qty 28
  Filled 2011-12-12 (×3): qty 1
  Filled 2011-12-12: qty 28
  Filled 2011-12-12 (×4): qty 1

## 2011-12-12 NOTE — Progress Notes (Signed)
Pt attended discharge planning group and actively participated.  Pt presents with calm mood and affect.  Pt rates depression and anxiety at a 2 today.  Pt denies SI.  Pt reports feeling well today.  Pt is scheduled to go to [facility] on Wednesday.  Pt's mother will transport pt there.  No further needs voiced by pt at this time.  Safety planning and suicide prevention discussed.     Reyes Ivan, LCSWA 12/12/2011  10:16 AM

## 2011-12-12 NOTE — Progress Notes (Signed)
Patient ID: Stephanie [name], female   DOB: May 16, 1971, 41 y.o.   MRN: [id number] Pt. Eyes closed, resp. Even unlabored, no distress noted. Staff will monitor q15min for safety.

## 2011-12-12 NOTE — Progress Notes (Signed)
[facility] MD Progress Note  12/12/2011 3:00 PM  S: "I feel a lot better. My mood is improving. But I'm still not sleeping well because of constant racing thoughts going on in my head at night. I have about 15 trends of thoughts going on in my head at a time.  It is keeping me from sleeping well. I bet the new place and new people added to it too. I feel agitated, anxious and restless whenever I'm sleep deprived. I will be going to [facility] recovery on Wednesday. I'm looking forward to it. I want to get off of Opiates for good. I have been on pain medications since I was in my 42s because of my health issues. I did not realize that while I was trying to deal with my pain issues, I was also creating another kind of problems for myself".  Diagnosis:   Axis I: Opiates withdrawal Axis II: Deferred Axis III:  Past Medical History  Diagnosis Date  . Crohn's disease   . GERD (gastroesophageal reflux disease)   . Abdominal pain   . Anxiety   . Migraine headache    Axis IV: Chronic health status. Axis V: 61-70 mild symptoms  ADL's:  Intact  Sleep: Poor  Appetite:  Good  Suicidal Ideation:  Plan:  No Intent:  No Means:  No Homicidal Ideation:  Plan:  No Intent:  no Means:  no  AEB (as evidenced by): Per patient's reports  Mental Status Examination/Evaluation: Objective:  Appearance: Casual  Eye Contact::  Good  Speech:  Clear and Coherent  Volume:  Normal  Mood:  Euthymic  Affect:  Appropriate  Thought Process:  Coherent, Intact and Logical  Orientation:  Full  Thought Content:  Rumination  Suicidal Thoughts:  No  Homicidal Thoughts:  No  Memory:  Immediate;   Good Recent;   Good Remote;   Good  Judgement:  Good  Insight:  Good  Psychomotor Activity:  Normal  Concentration:  Fair  Recall:  Good  Akathisia:  No  Handed:  Right  AIMS (if indicated):     Assets:  Desire for Improvement  Sleep:  Number of Hours: 3.75    Vital Signs:Blood pressure 131/91, pulse 82, temperature  97.4 F (36.3 C), temperature source Oral, resp. rate 16, height 5\' 6"  (1.676 m), weight 78.472 kg (173 lb), last menstrual period 12/01/2011. Current Medications: Current Facility-Administered Medications  Medication Dose Route Frequency Provider Last Rate Last Dose  . acetaminophen (TYLENOL) tablet 650 mg  650 mg Oral Q6H PRN Mickie D. Adams, PA      . alum & mag hydroxide-simeth (MAALOX/MYLANTA) 200-200-20 MG/5ML suspension 30 mL  30 mL Oral Q4H PRN Mickie D. Adams, PA      . chlordiazePOXIDE (LIBRIUM) capsule 25 mg  25 mg Oral Q6H PRN Mickie D. Adams, PA   25 mg at 12/09/11 1609  . cloNIDine (CATAPRES) tablet 0.1 mg  0.1 mg Oral BH-qamhs Mickie D. Adams, PA   0.1 mg at 12/12/11 0810   Followed by  . cloNIDine (CATAPRES) tablet 0.1 mg  0.1 mg Oral QAC breakfast Mickie D. Adams, PA      . dicyclomine (BENTYL) tablet 20 mg  20 mg Oral Q6H PRN Mickie D. Adams, PA   20 mg at 12/10/11 1314  . DULoxetine (CYMBALTA) DR capsule 60 mg  60 mg Oral Daily Jorje Guild, PA-C   60 mg at 12/12/11 0810  . loperamide (IMODIUM) capsule 2-4 mg  2-4 mg Oral PRN Mickie  D. Adams, PA      . magnesium hydroxide (MILK OF MAGNESIA) suspension 30 mL  30 mL Oral Daily PRN Mickie D. Adams, PA      . methocarbamol (ROBAXIN) tablet 500 mg  500 mg Oral Q8H PRN Mickie D. Adams, PA   500 mg at 12/11/11 2257  . multivitamin with minerals tablet 1 tablet  1 tablet Oral Daily Mickie D. Adams, PA   1 tablet at 12/12/11 0810  . naproxen (NAPROSYN) tablet 500 mg  500 mg Oral BID PRN Mickie D. Adams, PA      . ondansetron (ZOFRAN-ODT) disintegrating tablet 4 mg  4 mg Oral Q6H PRN Mickie D. Adams, PA      . risperiDONE (RISPERDAL) tablet 0.25 mg  0.25 mg Oral Daily PRN Jorje Guild, PA-C   0.25 mg at 12/12/11 1256  . risperiDONE (RISPERDAL) tablet 0.5 mg  0.5 mg Oral QHS Jorje Guild, PA-C   0.5 mg at 12/11/11 2212  . thiamine (B-1) injection 100 mg  100 mg Intramuscular Once Mickie D. Adams, PA      . thiamine (VITAMIN B-1) tablet 100 mg   100 mg Oral Daily Mickie D. Adams, PA   100 mg at 12/12/11 0810  . traZODone (DESYREL) tablet 50 mg  50 mg Oral QHS,MR X 1 Mickie D. Adams, PA   50 mg at 12/11/11 2257    Lab Results: No results found for this or any previous visit (from the past 48 hour(s)).  Physical Findings: AIMS:  , ,  ,  ,    CIWA:  CIWA-Ar Total: 2  COWS:  COWS Total Score: 4   Treatment Plan Summary: Daily contact with patient to assess and evaluate symptoms and progress in treatment Medication management  Plan: IncreaseTrazodone from 50 mg to 100 mg Q bedtime.           Change Risperdal 0.25 mg bid prn to bid routinely.           Continue current treatment plan.  Armandina Stammer I 12/12/2011, 3:00 PM

## 2011-12-12 NOTE — Progress Notes (Signed)
Pt is pleasant and cooperative. Pt goes to groups and participates. Pt rates depression and hopelessness at a 5. Pt was offered support and encouragement. Pt receptive to treatment and safety maintained on the unit.

## 2011-12-12 NOTE — Progress Notes (Signed)
[facility] Group Notes:  (Counselor/Nursing/MHT/Case Management/Adjunct)  12/12/2011 2:30 PM  Type of Therapy:  Group Therapy at 11:00  Participation Level:  Minimal; was called out of group by MHT  Participation Quality:  Attentive  Affect:  Appropriate  Cognitive:  Appropriate  Insight:  Unknown  Engagement in Group:  Limited, was called out of group by MHT  Engagement in Therapy:  Limited, was called out of group by MHT  Modes of Intervention:  Support and Exploration  Summary of Progress/Problems:  Stephanie [name] was attentive to group discussion focused on what patient's see as their own obstacles to recovery.  Stephanie [name] did not share what she saw as her biggest obstacles yet was especially attentive to discussion on "HALT" and 'extreme self care.'   [facility] Group Notes:  (Counselor/Nursing/MHT/Case Management/Adjunct)  12/12/2011   Type of Therapy:  Group Therapy at 1:15  Participation Level:  Active  Participation Quality:  Appropriate  Affect:  Appropriate  Cognitive:  Appropriate  Insight:  Good  Engagement in Group:  Good  Modes of Intervention:  Education and Socialization  Summary of Progress/Problems:  Patient attended group presentation by Interior and spatial designer of  [employer] ([employer]). Stephanie [name] was attentive and appropriate during session.  She shared if she had to name a song for her life "I can see clearly now" would be representative of her current progress.   [name] 12/12/2011, 2:42 PM

## 2011-12-13 MED ORDER — DULOXETINE HCL 60 MG PO CPEP: 60.0000 mg | ORAL_CAPSULE | Freq: Every day | ORAL | Status: DC

## 2011-12-13 MED ORDER — TRAZODONE HCL 100 MG PO TABS: 100.0000 mg | ORAL_TABLET | Freq: Every evening | ORAL | Status: DC | PRN

## 2011-12-13 MED ORDER — RISPERIDONE 0.5 MG PO TABS: 0.5000 mg | ORAL_TABLET | Freq: Every day | ORAL | Status: DC

## 2011-12-13 NOTE — Progress Notes (Signed)
[facility] Group Notes:  (Counselor/Nursing/MHT/Case Management/Adjunct)  12/13/2011 3:50 PM  Type of Therapy:  Group Therapy at 11  Participation Level:  Active  Participation Quality:  Appropriate, Attentive and Sharing  Affect:  Appropriate  Cognitive:  Alert, Appropriate and Oriented  Insight:  Good  Engagement in Group:  Good  Engagement in Therapy:  Good  Modes of Intervention:  Clarification, Socialization and Support  Summary of Progress/Problems:  Focus of group discussion today was feelings related to one's diagnosis of substance abuse, after completing a self test and discussing ASAM definition of additiction.    Stephanie [name] shared that she could relate to "making mountains out of mole hill, using to deal with withdrawal pain, losing interest in hobbies and other people and making bad decisions." [name] shared that her stay here has been "big wake up call and I look forward to getting back and living my life."   [name] 12/13/2011, 3:50 PM

## 2011-12-13 NOTE — Progress Notes (Signed)
Recreation Therapy Notes  12/13/2011         Time: 1415      Group Topic/Focus: The focus of this group is on discussing various styles of communication and communicating assertively using 'I' (feeling) statements.   Participation Level: Active  Participation Quality: Appropriate and Attentive  Affect: Appropriate  Cognitive: Oriented   Additional Comments: None.    Cerys Winget 12/13/2011 3:54 PM

## 2011-12-13 NOTE — Progress Notes (Signed)
Pt attended discharge planning group and actively participated.  Pt presents with calm mood and affect.  Pt denies having depression, anxiety and SI.  Pt reports feeling stable to d/c tomorrow morning.  Pt will go to [facility] tomorrow for further treatment.  Pt states that her mom will be picking her up at 7:00 am to transport her there.  No further needs voiced by pt at this time.    Reyes Ivan, LCSWA 12/13/2011  9:47 AM

## 2011-12-13 NOTE — Progress Notes (Signed)
Patient ID: Stephanie [name], female   DOB: 1970/09/30, 41 y.o.   MRN: [id number] Pt reports poor sleep and improving appetite.  Her energy level is low and ability to pay attention is improving.  She reports feeling some anxiety  And denies and thoughts of self harm.  She is interacting with peers and staff

## 2011-12-13 NOTE — Tx Team (Signed)
Interdisciplinary Treatment Plan Update (Adult)  Date:  12/13/2011  Time Reviewed:  10:09 AM   Progress in Treatment: Attending groups: Yes Participating in groups:  Yes Taking medication as prescribed: Yes Tolerating medication:  Yes Family/Significant othe contact made:  Yes Patient understands diagnosis:  Yes Discussing patient identified problems/goals with staff:  Yes Medical problems stabilized or resolved:  Yes Denies suicidal/homicidal ideation: Yes Issues/concerns per patient self-inventory:  None identified Other: N/A  New problem(s) identified: None Identified  Reason for Continuation of Hospitalization: Anxiety Depression Medication stabilization Withdrawal symptoms  Interventions implemented related to continuation of hospitalization: mood stabilization, medication monitoring and adjustment, group therapy and psycho education, safety checks q 15 mins  Additional comments: N/A  Estimated length of stay: 1 day  Discharge Plan: Pt will go to [facility] tomorrow for further treatment.    New goal(s): N/A  Review of initial/current patient goals per problem list:    1.  Goal(s): Address substance use  Met:  Yes  Target date: by discharge  As evidenced by: completed detox protocol and referred to appropriate treatment  2.  Goal (s): Reduce depressive and anxiety symptoms  Met:  Yes  Target date: by discharge  As evidenced by: Reducing depression from a 10 to a 3 as reported by pt.  Pt denies depression and anxiety today.   3.  Goal(s): Eliminate SI  Met:  Yes  Target date: by discharge  As evidenced by: Pt denies SI.     Attendees: Patient:  Stephanie [name] 12/13/2011 10:13 AM   Family:     Physician:  Lupe Carney, DO 12/13/2011 10:09 AM   Nursing: Roswell Miners, RN 12/13/2011 10:09 AM   Case Manager:  Reyes Ivan, LCSWA 12/13/2011  10:09 AM   Counselor:  Ronda Fairly, LCSWA 12/13/2011  10:09 AM   Other:  Richelle Ito, LCSW  12/13/2011 10:09 AM   Other:  Neill Loft, RN 12/13/2011 10:11 AM   Other:     Other:      Scribe for Treatment Team:   Reyes Ivan 12/13/2011 10:09 AM

## 2011-12-13 NOTE — [facility] Suicide Risk Assessment (Signed)
Suicide Risk Assessment  Discharge Assessment     Demographic factors:  Caucasian;Access to firearms  Current Mental Status Per Nursing Assessment:   On Admission:  Suicidal ideation indicated by patient At Discharge: Pt denied any SI/HI/thoughts of self harm or acute psychiatric issues in treatment team with clinical, nursing and medical team present.  Current Mental Status Per Physician: Patient seen and evaluated in team. Chart reviewed. Patient stated that her mood was "better each day". Said her "depression was minimal" and she denied any SEs to her meds.  Her affect was mood congruent and brighter. She denied any current thoughts of self injurious behavior, suicidal ideation or homicidal ideation. There were no auditory or visual hallucinations, paranoia, delusional thought processes, or mania noted.  Thought process was linear and goal directed.  No psychomotor agitation or retardation was noted. Speech was normal rate, tone and volume. Eye contact was good. Judgment and insight are fair.    Loss Factors:  Decrease in vocational status;Loss of significant relationship;Decline in physical health;Financial problems / change in socioeconomic status  Historical Factors:  Family history of mental illness or substance abuse; SI prior to admission  Risk Reduction Factors:  Sense of responsibility to family;Living with another person, especially a relative;Positive social support;Positive therapeutic relationship; Willing to transfer to [facility]  Discharge Diagnoses: AXIS I:  Opioid W/D; Mood Disorder NOS AXIS II: Deferred AXIS III:   Past Medical History  Diagnosis Date  . Crohn's disease   . GERD (gastroesophageal reflux disease)   . Abdominal pain   . Anxiety   . Migraine headache    AXIS IV: Moderate AXIS V:  50  Cognitive Features That Contribute To Risk:  limited insight; impulsivity.  Suicide Risk: Pt viewed as a chronic increased risk of harm to self in light of her past hx  and risk factors.  No acute safety concerns on the unit.  Pt contracting for safety and stable for transfer to [facility].  Plan Of Care/Follow-up recommendations: Pt seen and evaluated in treatment team. Chart reviewed.  Pt stable for and requesting discharge. Pt contracting for safety and does not currently meet [place] involuntary commitment criteria for continued hospitalization against her will.  Mental health treatment, medication management and continued sobriety will mitigate against the potential increased risk of harm to self and/or others.  Discussed the importance of recovery further with pt, as well as, tools to move forward in a healthy & safe manner.  Pt agreeable with the plan.  Discussed with the team.  Please see orders, follow up appointments per AVS and full discharge summary to be completed by physician extender.  Recommend follow up with NA.  Diet: Regular.  Activity: As tolerated.  Discharge to [facility] in am.   Lupe Carney 12/13/2011, 4:38 PM

## 2011-12-13 NOTE — Progress Notes (Signed)
Patient ID: Stephanie [name], female   DOB: Oct 13, 1970, 41 y.o.   MRN: [id number] [facility] "States she felt like she was on a roller coaster, and decided to come for help.

## 2011-12-14 NOTE — Progress Notes (Signed)
Patient has no complaints.  Patient excited about being discharged.  Patient denies SI/HI. No depression noted.  Patient in room asleep. Staff to monitor Q43mins for safety

## 2011-12-14 NOTE — Tx Team (Signed)
Interdisciplinary Treatment Plan Update (Adult)  Date:  12/14/2011  Time Reviewed:  9:54 AM   Progress in Treatment: Attending groups: Yes Participating in groups:  Yes Taking medication as prescribed: Yes Tolerating medication:  Yes Family/Significant othe contact made:  Yes Patient understands diagnosis:  Yes Discussing patient identified problems/goals with staff:  Yes Medical problems stabilized or resolved:  Yes Denies suicidal/homicidal ideation: Yes Issues/concerns per patient self-inventory:  None identified Other: N/A  New problem(s) identified: None Identified  Reason for Continuation of Hospitalization: Stable to d/c  Interventions implemented related to continuation of hospitalization: Stable to d/c  Additional comments: N/A  Estimated length of stay: D/C today  Discharge Plan: Stephanie [name] went to [facility] for further treatment  New goal(s): N/A  Review of initial/current patient goals per problem list:    1.  Goal(s): Address substance use  Met:  Yes  Target date: by discharge  As evidenced by: completed detox protocol and referred to appropriate treatment  2.  Goal (s): Reduce depressive and anxiety symptoms  Met:  Yes  Target date: by discharge  As evidenced by: Reducing depression from a 10 to a 3 as reported by Stephanie [name].  Stephanie [name] denies having depression and anxiety.     3.  Goal(s): Eliminate SI  Met:  Yes  Target date: by discharge  As evidenced by: Stephanie [name] denies SI.    Attendees: Patient:  Stephanie [name] d/c early this morning   Family:     Physician:  Lupe Carney, DO 12/14/2011 9:54 AM   Nursing:  12/14/2011 9:54 AM   Case Manager:  Reyes Ivan, LCSWA 12/14/2011  9:54 AM   Counselor:  Ronda Fairly, LCSWA 12/14/2011  9:54 AM   Other:  Richelle Ito, LCSW 12/14/2011 9:54 AM   Other:     Other:     Other:      Scribe for Treatment Team:   Reyes Ivan 12/14/2011 9:54 AM

## 2011-12-14 NOTE — Progress Notes (Signed)
Patient ID: Stephanie [name], female   DOB: Jun 08, 1971, 41 y.o.   MRN: [id number] Patient discharged home.  Patient released to her mother who will drive her to [facility].  Writer explained to the patient and patient verbalized understanding.  All belongings returned and patient locker emptied and contents were discarded per patient request.  No complaints no further montoring

## 2011-12-14 NOTE — Progress Notes (Signed)
[facility] Case Management Discharge Plan:  Will you be returning to the same living situation after discharge: Yes,  return home after treatment At discharge, do you have transportation home?:Yes,  mom transported pt to tx Do you have the ability to pay for your medications:Yes,  access to meds  Release of information consent forms completed and in the chart;  Patient's signature needed at discharge.  Patient to Follow up at:  Follow-up Information    Follow up with [facility] on 12/14/2011. (Arrive there at 8:00 am promptly!)    Contact information:   [address] [place], [place] [address] [phone]         Patient denies SI/HI:   Yes,  denies SI/HI    Safety Planning and Suicide Prevention discussed:  Yes,  discussed with pt  Barrier to discharge identified:No.  Summary and Recommendations: No recommendations from SW.  No further needs voiced by pt.  Pt stable to discharge.     Carmina Miller 12/14/2011, 8:02 AM

## 2011-12-16 NOTE — Progress Notes (Signed)
Patient Discharge Instructions:  After Visit Summary (AVS):   Faxed to:  12/15/2012 Psychiatric Admission Assessment Note:   Faxed to:  12/15/2012 Suicide Risk Assessment - Discharge Assessment:   Faxed to:  12/15/2012 Faxed/Sent to the Next Level Care provider:  12/15/2012  Information faxed to: [facility] @ [phone]  Stephanie [name] Brittini, 12/16/2011, 4:43 PM

## 2012-01-05 NOTE — Discharge Summary (Signed)
Physician Discharge Summary Note  Patient:  Stephanie [name] is an 41 y.o., female MRN:  [id number] DOB:  29-Aug-1970 Patient phone:  [phone] (home)  Patient address:   [address] [place] [address],   Date of Admission:  12/08/2011 Date of Discharge: 12/14/2011  Reason for Admission: Detox from opiates  Discharge Diagnoses: Principal Problem:  *Opiate withdrawal   Axis Diagnosis:  Discharge Diagnoses:  AXIS I: Opioid W/D; Mood Disorder NOS  AXIS II: Deferred  AXIS III:  Past Medical History   Diagnosis  Date   .  Crohn's disease    .  GERD (gastroesophageal reflux disease)    .  Abdominal pain    .  Anxiety    .  Migraine headache     AXIS IV: Moderate  AXIS V: 50 Level of Care:  OP  Hospital Course:  The patient was admitted for detox from opiates and was treated with the clonidine protocol.  Her depression was treated with Duloxetine and her and risperdal was used for anxiety and sleep.  She was encouraged to participate in groups.  Her response to withdrawal was monitored by daily COWS scores.  Her response to treatment was monitored with daily self inventories to monitor her mood and mental status.  The patient requested further treatment upon discharge and requested a bed in a residential treatment facility.  A bed at [facility] was found and the patient discharged out on the 6th day.  Consults:  None  Significant Diagnostic Studies:  None  Discharge Vitals:   Blood pressure 135/91, pulse 80, temperature 97.9 F (36.6 C), temperature source Oral, resp. rate 14, height 5\' 6"  (1.676 m), weight 78.472 kg (173 lb), last menstrual period 12/01/2011.  Mental Status Exam: See Mental Status Examination and Suicide Risk Assessment completed by Attending Physician prior to discharge.  Discharge destination:  [facility] Is patient on multiple antipsychotic therapies at discharge:  No   Has Patient had three or more failed trials of antipsychotic  monotherapy by history:  No Recommended Plan for Multiple Antipsychotic Therapies: not applicable  Medication List  As of 01/05/2012  4:44 PM   STOP taking these medications         ALPRAZolam 1 MG tablet      cloNIDine 0.1 MG tablet      ondansetron 8 MG disintegrating tablet      promethazine 25 MG tablet         TAKE these medications      Indication    DULoxetine 60 MG capsule   Commonly known as: CYMBALTA   Take 1 capsule (60 mg total) by mouth daily. For depression       risperiDONE 0.5 MG tablet   Commonly known as: RISPERDAL   Take 1 tablet (0.5 mg total) by mouth at bedtime. Take half a tablet twice a day and 1 tablet at bedtime for mood control and sleep.       traZODone 100 MG tablet   Commonly known as: DESYREL   Take 1 tablet (100 mg total) by mouth at bedtime and may repeat dose one time if needed. For sleep            Follow-up Information    Follow up with [facility] on 12/14/2011. (Arrive there at 8:00 am promptly!)    Contact information:   [address] [place], [place] [address] [phone]         Follow-up recommendations:  Eat a heart healthy  diet.  Exercise daily as tolerated. Get plenty of rest.    Comments:    Signed: Lloyd Huger T. Venise Ellingwood PAC For Dr. Lupe Carney 01/05/2012, 4:44 PM

## 2012-06-03 IMAGING — CR DG CHEST 2V
2 series · 2 of 2 positions shown · non-contrast
Comparison: CT chest 09/13/2005 and chest radiograph 09/08/2005

CLINICAL DATA: Cough and fever.

CHEST - 2 VIEW

[w chest pa]
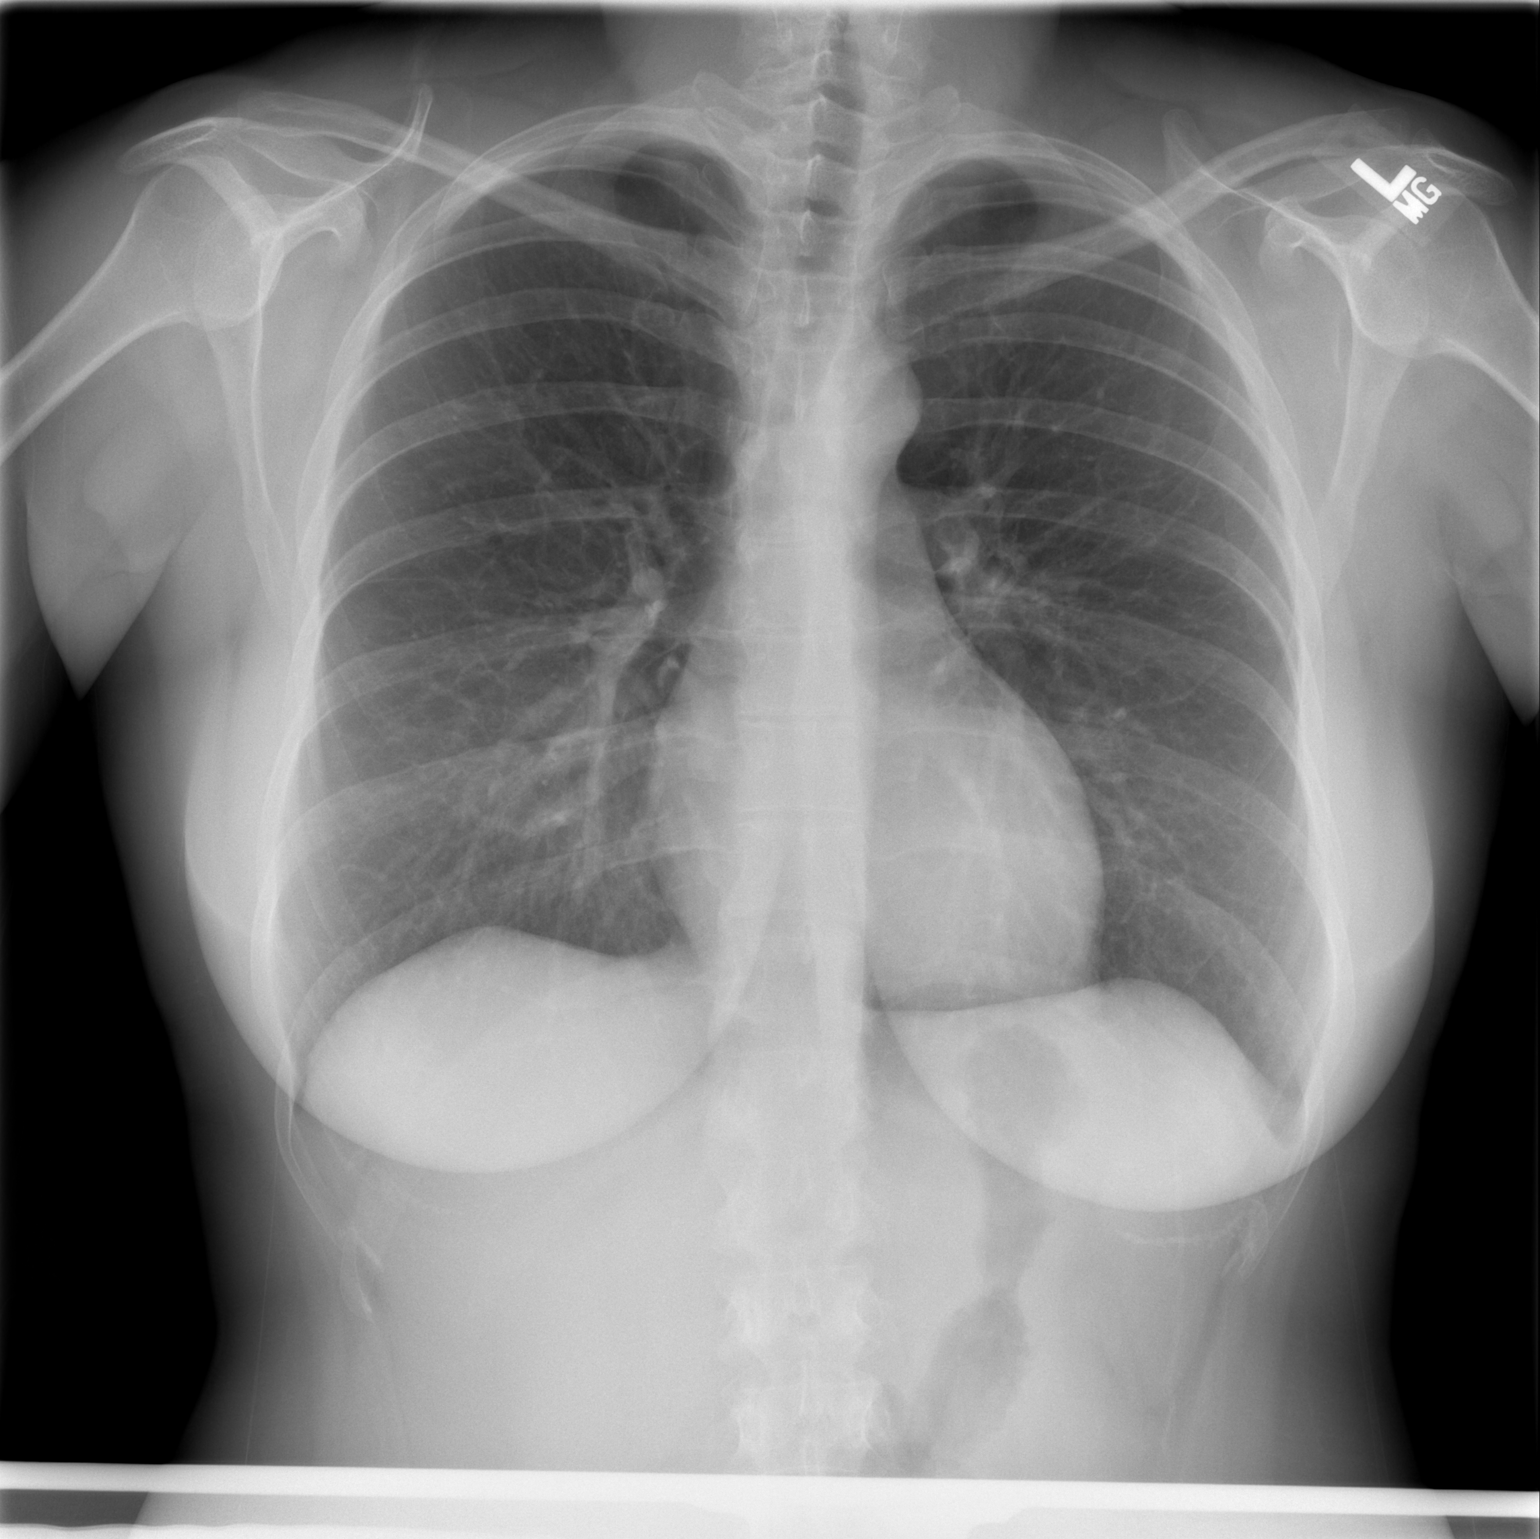

[w chest lat]
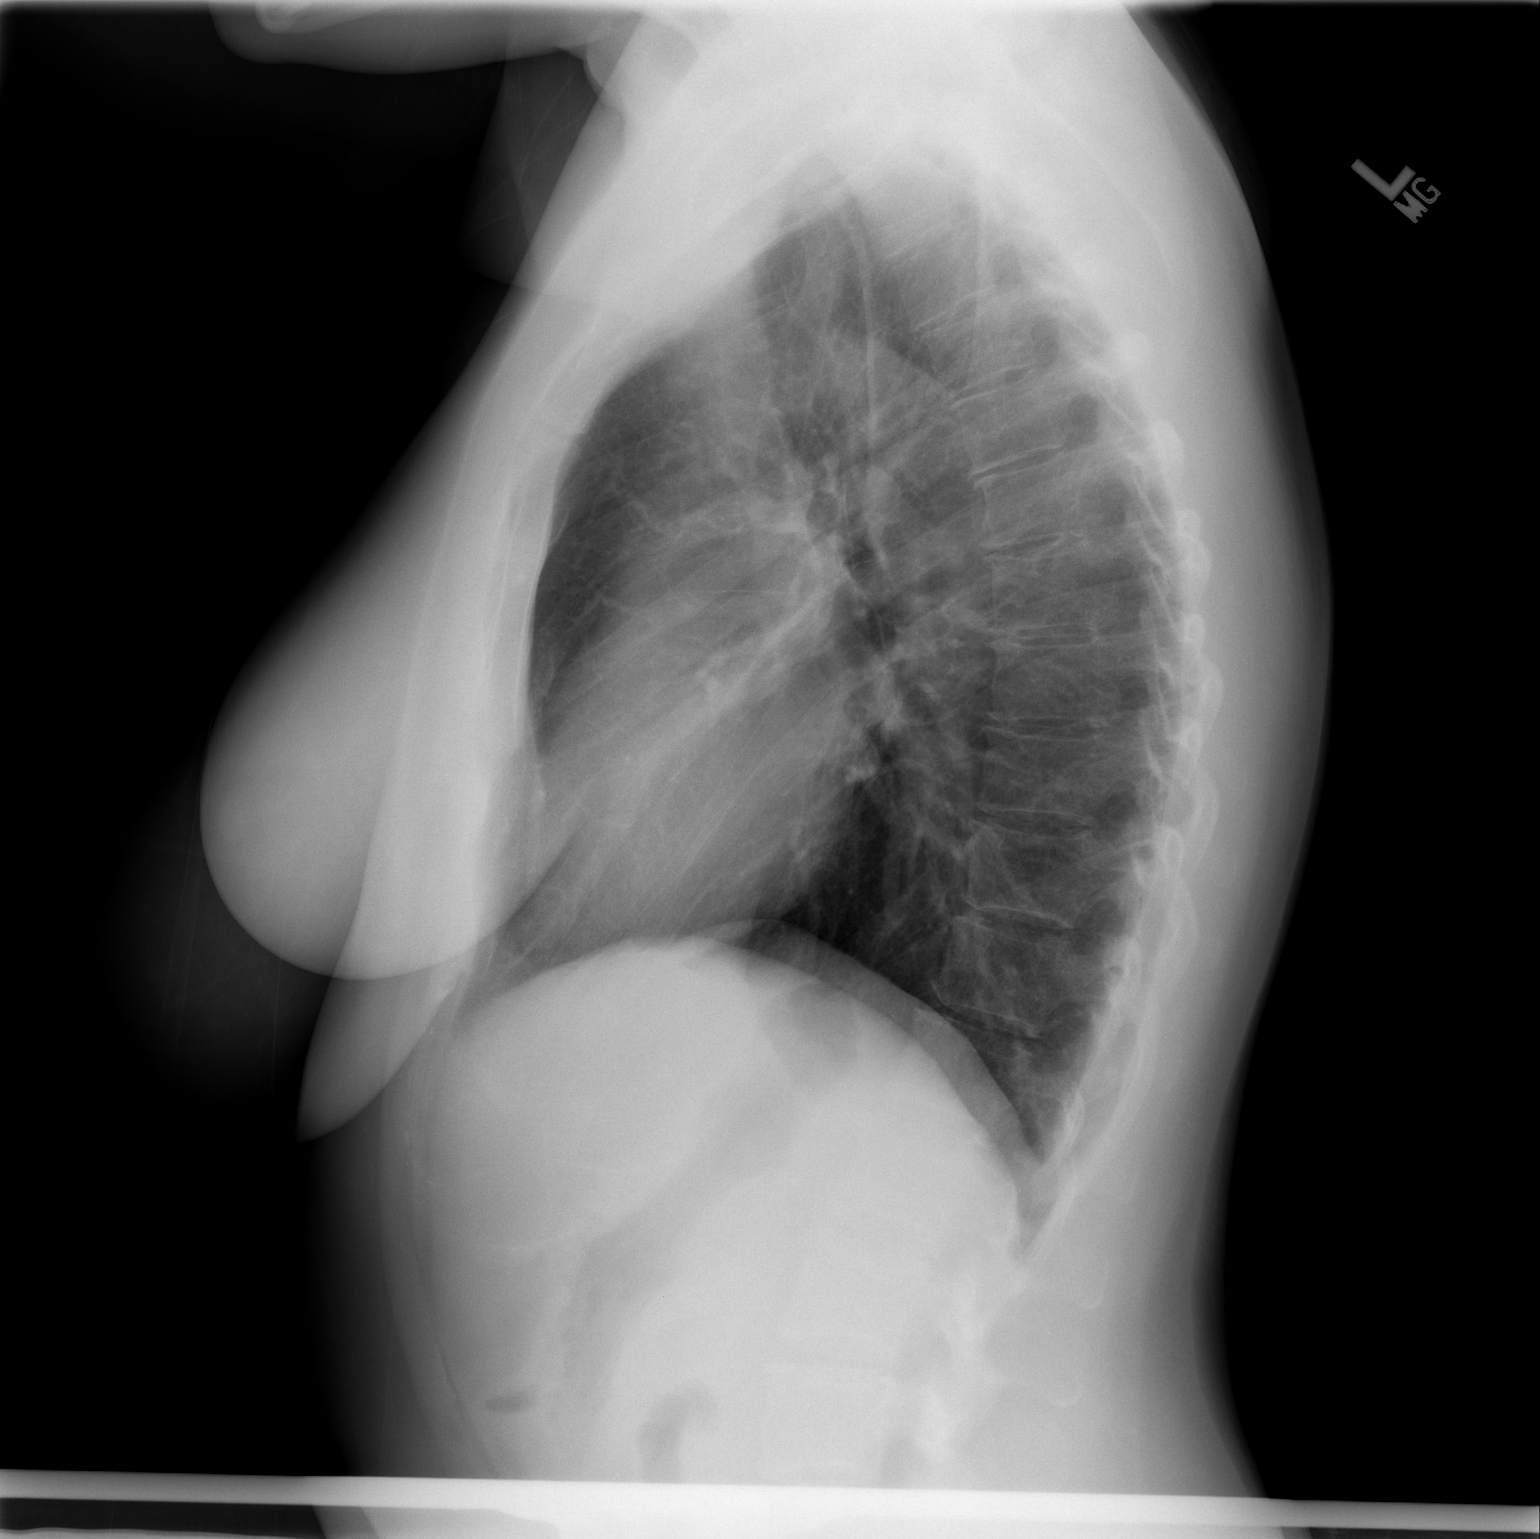

[2 of 2 positions shown; findings below may reference images not displayed]

FINDINGS: Trachea is midline.  Heart size normal.  Lungs are clear.
No pleural fluid.
IMPRESSION: No acute findings

## 2013-04-04 ENCOUNTER — Other Ambulatory Visit: Payer: Self-pay | Admitting: Obstetrics and Gynecology

## 2013-04-04 DIAGNOSIS — N63 Unspecified lump in unspecified breast: Secondary | ICD-10-CM

## 2013-04-17 ENCOUNTER — Ambulatory Visit
Admission: RE | Admit: 2013-04-17 | Discharge: 2013-04-17 | Disposition: A | Discharge status: Home / Self Care | Payer: BC Managed Care – PPO | Source: Ambulatory Visit | Attending: Obstetrics and Gynecology | Admitting: Obstetrics and Gynecology

## 2013-04-17 DIAGNOSIS — N63 Unspecified lump in unspecified breast: Secondary | ICD-10-CM

## 2013-08-23 ENCOUNTER — Emergency Department (HOSPITAL_COMMUNITY): Payer: BC Managed Care – PPO

## 2013-08-23 ENCOUNTER — Encounter (HOSPITAL_COMMUNITY): Payer: Self-pay | Admitting: Emergency Medicine

## 2013-08-23 ENCOUNTER — Inpatient Hospital Stay (HOSPITAL_COMMUNITY)
Admission: EM | Admit: 2013-08-23 | Discharge: 2013-08-28 | DRG: 682 | Disposition: A | Discharge status: Home / Self Care | Payer: BC Managed Care – PPO | Attending: Family Medicine | Admitting: Family Medicine

## 2013-08-23 DIAGNOSIS — A088 Other specified intestinal infections: Secondary | ICD-10-CM | POA: Diagnosis present

## 2013-08-23 DIAGNOSIS — K5090 Crohn's disease, unspecified, without complications: Secondary | ICD-10-CM | POA: Diagnosis present

## 2013-08-23 DIAGNOSIS — K859 Acute pancreatitis without necrosis or infection, unspecified: Secondary | ICD-10-CM | POA: Diagnosis present

## 2013-08-23 DIAGNOSIS — R42 Dizziness and giddiness: Secondary | ICD-10-CM | POA: Diagnosis present

## 2013-08-23 DIAGNOSIS — R82998 Other abnormal findings in urine: Secondary | ICD-10-CM | POA: Diagnosis present

## 2013-08-23 DIAGNOSIS — D72829 Elevated white blood cell count, unspecified: Secondary | ICD-10-CM | POA: Diagnosis present

## 2013-08-23 DIAGNOSIS — N823 Fistula of vagina to large intestine: Secondary | ICD-10-CM

## 2013-08-23 DIAGNOSIS — F1123 Opioid dependence with withdrawal: Secondary | ICD-10-CM

## 2013-08-23 DIAGNOSIS — N179 Acute kidney failure, unspecified: Principal | ICD-10-CM | POA: Diagnosis present

## 2013-08-23 DIAGNOSIS — R1110 Vomiting, unspecified: Secondary | ICD-10-CM

## 2013-08-23 DIAGNOSIS — G43909 Migraine, unspecified, not intractable, without status migrainosus: Secondary | ICD-10-CM

## 2013-08-23 DIAGNOSIS — E876 Hypokalemia: Secondary | ICD-10-CM | POA: Diagnosis present

## 2013-08-23 DIAGNOSIS — R112 Nausea with vomiting, unspecified: Secondary | ICD-10-CM | POA: Diagnosis present

## 2013-08-23 DIAGNOSIS — K529 Noninfective gastroenteritis and colitis, unspecified: Secondary | ICD-10-CM | POA: Diagnosis present

## 2013-08-23 DIAGNOSIS — F1193 Opioid use, unspecified with withdrawal: Secondary | ICD-10-CM

## 2013-08-23 DIAGNOSIS — F419 Anxiety disorder, unspecified: Secondary | ICD-10-CM

## 2013-08-23 DIAGNOSIS — Z9049 Acquired absence of other specified parts of digestive tract: Secondary | ICD-10-CM

## 2013-08-23 DIAGNOSIS — R1013 Epigastric pain: Secondary | ICD-10-CM | POA: Diagnosis present

## 2013-08-23 DIAGNOSIS — Z932 Ileostomy status: Secondary | ICD-10-CM

## 2013-08-23 DIAGNOSIS — R509 Fever, unspecified: Secondary | ICD-10-CM

## 2013-08-23 DIAGNOSIS — K219 Gastro-esophageal reflux disease without esophagitis: Secondary | ICD-10-CM | POA: Diagnosis present

## 2013-08-23 DIAGNOSIS — I10 Essential (primary) hypertension: Secondary | ICD-10-CM | POA: Diagnosis present

## 2013-08-23 DIAGNOSIS — F411 Generalized anxiety disorder: Secondary | ICD-10-CM | POA: Diagnosis present

## 2013-08-23 DIAGNOSIS — E860 Dehydration: Secondary | ICD-10-CM | POA: Diagnosis present

## 2013-08-23 DIAGNOSIS — F329 Major depressive disorder, single episode, unspecified: Secondary | ICD-10-CM

## 2013-08-23 DIAGNOSIS — IMO0002 Reserved for concepts with insufficient information to code with codable children: Secondary | ICD-10-CM

## 2013-08-23 DIAGNOSIS — K5289 Other specified noninfective gastroenteritis and colitis: Secondary | ICD-10-CM

## 2013-08-23 DIAGNOSIS — F32A Depression, unspecified: Secondary | ICD-10-CM

## 2013-08-23 DIAGNOSIS — Z87891 Personal history of nicotine dependence: Secondary | ICD-10-CM

## 2013-08-23 LAB — CBC WITH DIFFERENTIAL/PLATELET
BASOS ABS: 0 10*3/uL (ref 0.0–0.1)
BASOS PCT: 0 % (ref 0–1)
Eosinophils Absolute: 0 10*3/uL (ref 0.0–0.7)
Eosinophils Relative: 0 % (ref 0–5)
HCT: 41.6 % (ref 36.0–46.0)
HEMOGLOBIN: 15.3 g/dL — AB (ref 12.0–15.0)
Lymphocytes Relative: 10 % — ABNORMAL LOW (ref 12–46)
Lymphs Abs: 1.2 10*3/uL (ref 0.7–4.0)
MCH: 31.8 pg (ref 26.0–34.0)
MCHC: 36.8 g/dL — AB (ref 30.0–36.0)
MCV: 86.5 fL (ref 78.0–100.0)
Monocytes Absolute: 0.9 10*3/uL (ref 0.1–1.0)
Monocytes Relative: 7 % (ref 3–12)
NEUTROS ABS: 10.3 10*3/uL — AB (ref 1.7–7.7)
NEUTROS PCT: 83 % — AB (ref 43–77)
Platelets: 305 10*3/uL (ref 150–400)
RBC: 4.81 MIL/uL (ref 3.87–5.11)
RDW: 13.1 % (ref 11.5–15.5)
WBC: 12.4 10*3/uL — AB (ref 4.0–10.5)

## 2013-08-23 LAB — COMPREHENSIVE METABOLIC PANEL
ALBUMIN: 4.1 g/dL (ref 3.5–5.2)
ALT: 22 U/L (ref 0–35)
AST: 18 U/L (ref 0–37)
Alkaline Phosphatase: 56 U/L (ref 39–117)
BILIRUBIN TOTAL: 0.4 mg/dL (ref 0.3–1.2)
BUN: 49 mg/dL — AB (ref 6–23)
CHLORIDE: 95 meq/L — AB (ref 96–112)
CO2: 21 mEq/L (ref 19–32)
Calcium: 9.2 mg/dL (ref 8.4–10.5)
Creatinine, Ser: 1.71 mg/dL — ABNORMAL HIGH (ref 0.50–1.10)
GFR calc Af Amer: 42 mL/min — ABNORMAL LOW (ref >90)
GFR calc non Af Amer: 36 mL/min — ABNORMAL LOW (ref >90)
Glucose, Bld: 107 mg/dL — ABNORMAL HIGH (ref 70–99)
Potassium: 3.5 mEq/L — ABNORMAL LOW (ref 3.7–5.3)
Sodium: 136 mEq/L — ABNORMAL LOW (ref 137–147)
Total Protein: 8.2 g/dL (ref 6.0–8.3)

## 2013-08-23 LAB — SEDIMENTATION RATE: Sed Rate: 28 mm/hr — ABNORMAL HIGH (ref 0–22)

## 2013-08-23 LAB — HCG, SERUM, QUALITATIVE: Preg, Serum: NEGATIVE

## 2013-08-23 LAB — WET PREP, GENITAL
Clue Cells Wet Prep HPF POC: NONE SEEN
TRICH WET PREP: NONE SEEN
YEAST WET PREP: NONE SEEN

## 2013-08-23 MED ORDER — PROMETHAZINE HCL 25 MG/ML IJ SOLN
25.0000 mg | Freq: Once | INTRAMUSCULAR | Status: AC
  Administered 2013-08-23: 25 mg via INTRAMUSCULAR
  Filled 2013-08-23: qty 1

## 2013-08-23 MED ORDER — ONDANSETRON HCL 4 MG/2ML IJ SOLN
4.0000 mg | Freq: Four times a day (QID) | INTRAMUSCULAR | Status: DC | PRN
  Administered 2013-08-25: 4 mg via INTRAVENOUS
  Filled 2013-08-23 (×3): qty 2

## 2013-08-23 MED ORDER — IOHEXOL 300 MG/ML  SOLN
80.0000 mL | Freq: Once | INTRAMUSCULAR | Status: AC | PRN
  Administered 2013-08-23: 80 mL via INTRAVENOUS

## 2013-08-23 MED ORDER — SODIUM CHLORIDE 0.9 % IV SOLN
INTRAVENOUS | Status: DC
  Administered 2013-08-23 – 2013-08-25 (×3): via INTRAVENOUS

## 2013-08-23 MED ORDER — SODIUM CHLORIDE 0.9 % IV BOLUS (SEPSIS)
1000.0000 mL | Freq: Once | INTRAVENOUS | Status: AC
  Administered 2013-08-23: 1000 mL via INTRAVENOUS

## 2013-08-23 MED ORDER — SODIUM CHLORIDE 0.9 % IV SOLN
25.0000 mg | Freq: Once | INTRAVENOUS | Status: DC
  Filled 2013-08-23: qty 1

## 2013-08-23 MED ORDER — ACETAMINOPHEN 325 MG PO TABS
650.0000 mg | ORAL_TABLET | Freq: Once | ORAL | Status: AC
  Administered 2013-08-23: 650 mg via ORAL
  Filled 2013-08-23: qty 2

## 2013-08-23 MED ORDER — ONDANSETRON 4 MG PO TBDP
4.0000 mg | ORAL_TABLET | Freq: Once | ORAL | Status: AC
  Administered 2013-08-23: 4 mg via ORAL
  Filled 2013-08-23: qty 1

## 2013-08-23 MED ORDER — ONDANSETRON HCL 4 MG PO TABS
4.0000 mg | ORAL_TABLET | Freq: Four times a day (QID) | ORAL | Status: DC | PRN
  Administered 2013-08-23 – 2013-08-24 (×3): 4 mg via ORAL
  Filled 2013-08-23 (×4): qty 1

## 2013-08-23 MED ORDER — KETOROLAC TROMETHAMINE 30 MG/ML IJ SOLN
30.0000 mg | Freq: Once | INTRAMUSCULAR | Status: AC
  Administered 2013-08-23: 30 mg via INTRAVENOUS
  Filled 2013-08-23: qty 2

## 2013-08-23 MED ORDER — ACETAMINOPHEN 325 MG PO TABS
650.0000 mg | ORAL_TABLET | Freq: Four times a day (QID) | ORAL | Status: DC | PRN
  Administered 2013-08-27: 650 mg via ORAL
  Filled 2013-08-23: qty 2

## 2013-08-23 MED ORDER — ONDANSETRON HCL 4 MG/2ML IJ SOLN
4.0000 mg | Freq: Once | INTRAMUSCULAR | Status: AC
  Administered 2013-08-23: 4 mg via INTRAVENOUS
  Filled 2013-08-23: qty 2

## 2013-08-23 MED ORDER — DEXTROSE-NACL 5-0.45 % IV SOLN: INTRAVENOUS | Status: DC

## 2013-08-23 MED ORDER — GLYCOPYRROLATE 0.2 MG/ML IJ SOLN
0.1000 mg | Freq: Once | INTRAMUSCULAR | Status: AC
  Administered 2013-08-23: 0.1 mg via INTRAVENOUS
  Filled 2013-08-23: qty 1

## 2013-08-23 MED ORDER — SODIUM CHLORIDE 0.9 % IJ SOLN: 10.0000 mL | INTRAMUSCULAR | Status: DC | PRN

## 2013-08-23 MED ORDER — POTASSIUM CHLORIDE CRYS ER 20 MEQ PO TBCR
40.0000 meq | EXTENDED_RELEASE_TABLET | Freq: Two times a day (BID) | ORAL | Status: AC
  Administered 2013-08-23 – 2013-08-24 (×2): 40 meq via ORAL
  Filled 2013-08-23 (×2): qty 2

## 2013-08-23 MED ORDER — SODIUM CHLORIDE 0.9 % IJ SOLN
10.0000 mL | Freq: Two times a day (BID) | INTRAMUSCULAR | Status: DC
  Administered 2013-08-24 – 2013-08-27 (×7): 10 mL

## 2013-08-23 MED ORDER — ACETAMINOPHEN 650 MG RE SUPP: 650.0000 mg | Freq: Four times a day (QID) | RECTAL | Status: DC | PRN

## 2013-08-23 MED ORDER — ONDANSETRON HCL 4 MG/2ML IJ SOLN
4.0000 mg | Freq: Three times a day (TID) | INTRAMUSCULAR | Status: AC | PRN
Start: 2013-08-23 — End: 2013-08-24

## 2013-08-23 MED ORDER — PANTOPRAZOLE SODIUM 40 MG IV SOLR
40.0000 mg | Freq: Once | INTRAVENOUS | Status: AC
  Administered 2013-08-23: 40 mg via INTRAVENOUS
  Filled 2013-08-23: qty 40

## 2013-08-23 MED ORDER — IOHEXOL 300 MG/ML  SOLN
50.0000 mL | Freq: Once | INTRAMUSCULAR | Status: AC | PRN
  Administered 2013-08-23: 50 mL via ORAL

## 2013-08-23 MED ORDER — TRAMADOL HCL 50 MG PO TABS
50.0000 mg | ORAL_TABLET | Freq: Four times a day (QID) | ORAL | Status: DC | PRN
  Administered 2013-08-23 – 2013-08-28 (×17): 50 mg via ORAL
  Filled 2013-08-23 (×19): qty 1

## 2013-08-23 MED ORDER — ENOXAPARIN SODIUM 40 MG/0.4ML ~~LOC~~ SOLN
40.0000 mg | SUBCUTANEOUS | Status: DC
  Administered 2013-08-23 – 2013-08-27 (×5): 40 mg via SUBCUTANEOUS
  Filled 2013-08-23 (×6): qty 0.4

## 2013-08-23 MED ORDER — PROMETHAZINE HCL 25 MG/ML IJ SOLN: 25.0000 mg | Freq: Once | INTRAMUSCULAR | Status: DC

## 2013-08-23 MED ORDER — AMLODIPINE BESYLATE 5 MG PO TABS
5.0000 mg | ORAL_TABLET | Freq: Every day | ORAL | Status: DC
  Administered 2013-08-24 – 2013-08-28 (×5): 5 mg via ORAL
  Filled 2013-08-23 (×5): qty 1

## 2013-08-23 NOTE — ED Notes (Signed)
Bed: WA14 Expected date:  Expected time:  Means of arrival:  Comments: EMS/nv 

## 2013-08-23 NOTE — ED Notes (Signed)
Pt stating that she is an extremely hard stick.  Has had multiple surgeries in the past.  Notified IV therapy.  Unable to see obvious veins upon assessment.  Pt notified of delay.

## 2013-08-23 NOTE — ED Notes (Signed)
Pt care delay d/t trying to get IV access.  Access now in foot.  Attempt to get Labs next.

## 2013-08-23 NOTE — ED Notes (Signed)
Patient transported to CT 

## 2013-08-23 NOTE — ED Provider Notes (Signed)
CSN: [id number]     Arrival date & time 08/23/13  1057 History   First MD Initiated Contact with Patient 08/23/13 1058     Chief Complaint  Patient presents with  . Abdominal Pain  . Emesis      HPI  Patient presents with abdominal pain vomiting and increased ostomy output. She has a history of Crohn's disease. Status post colectomy. Had a few episodes of bowel obstruction immediately postoperative, over 10 years ago. Surgeries were performed at [facility]. He just started yesterday. Increased ostomy output. Abdominal pain and cramping. Refractory vomiting yesterday and today. Dizzy lightheaded and painful, she presents here. No fever, no output of blood in her ostomy, or with her emesis.  Has also had repair of enterovaginal fistulas. Has had some discharge over the last few days. Her last menstrual period was one week ago. She is not having feces-like discharge from her vagina she has in the past with her fistulas   Past Medical History  Diagnosis Date  . Crohn's disease   . GERD (gastroesophageal reflux disease)   . Abdominal pain   . Anxiety   . Migraine headache    Past Surgical History  Procedure Laterality Date  . Vagina surgery      rectovaginal fistula repair  . Colon surgery      removal of rectum, anus and entire large colon  . Colostomy     Family History  Problem Relation Age of Onset  . Depression Maternal Aunt   . Heart attack Paternal Uncle   . Stroke Maternal Grandmother 23  . Depression Maternal Grandmother   . Nephrolithiasis Other   . Other Mother     chrons disease  . Cancer Paternal Grandfather     throat  . Drug abuse Maternal Uncle   . Drug abuse Cousin     maternal  . Drug abuse Cousin     paternal   History  Substance Use Topics  . Smoking status: Former Smoker -- 10 years    Types: Cigarettes    Quit date: 07/04/2000  . Smokeless tobacco: Never Used     Comment: smoked 2-3 cigarettes/day  . Alcohol Use: No   OB History   Grav Para Term Preterm Abortions TAB SAB Ect Mult Living                 Review of Systems  Constitutional: Negative for fever, chills, diaphoresis, appetite change and fatigue.  HENT: Negative for mouth sores, sore throat and trouble swallowing.   Eyes: Negative for visual disturbance.  Respiratory: Negative for cough, chest tightness, shortness of breath and wheezing.   Cardiovascular: Negative for chest pain.  Gastrointestinal: Positive for nausea, vomiting and abdominal pain. Negative for diarrhea and abdominal distention.       Increased ostomy output  Endocrine: Negative for polydipsia, polyphagia and polyuria.  Genitourinary: Negative for dysuria, frequency and hematuria.  Musculoskeletal: Negative for gait problem.  Skin: Negative for color change, pallor and rash.  Neurological: Negative for dizziness, syncope, light-headedness and headaches.  Hematological: Does not bruise/bleed easily.  Psychiatric/Behavioral: Negative for behavioral problems and confusion.      Allergies  Flagyl and Stadol  Home Medications   No current outpatient prescriptions on file. BP 96/70  Pulse 60  Temp(Src) 97.1 F (36.2 C) (Oral)  Resp 16  Ht 5\' 6"  (1.676 m)  Wt 150 lb (68.04 kg)  BMI 24.22 kg/m2  SpO2 98%  LMP 08/07/2013 Physical Exam  Constitutional: She  is oriented to person, place, and time. She appears well-developed and well-nourished. No distress.  HENT:  Head: Normocephalic.  Eyes: Conjunctivae are normal. Pupils are equal, round, and reactive to light. No scleral icterus.  Neck: Normal range of motion. Neck supple. No thyromegaly present.  Cardiovascular: Normal rate and regular rhythm.  Exam reveals no gallop and no friction rub.   No murmur heard. Pulmonary/Chest: Effort normal and breath sounds normal. No respiratory distress. She has no wheezes. She has no rales.  Tachypneic. Her lungs.  Abdominal: Soft. Bowel sounds are normal. She exhibits no distension. There is  no tenderness. There is no rebound.  Musculoskeletal: Normal range of motion.  Neurological: She is alert and oriented to person, place, and time.  Skin: Skin is warm and dry. No rash noted.  Psychiatric: She has a normal mood and affect. Her behavior is normal.    ED Course  Procedures (including critical care time) Labs Review Labs Reviewed  WET PREP, GENITAL - Abnormal; Notable for the following:    WBC, Wet Prep HPF POC MODERATE (*)    All other components within normal limits  CBC WITH DIFFERENTIAL - Abnormal; Notable for the following:    WBC 12.4 (*)    Hemoglobin 15.3 (*)    MCHC 36.8 (*)    Neutrophils Relative % 83 (*)    Neutro Abs 10.3 (*)    Lymphocytes Relative 10 (*)    All other components within normal limits  COMPREHENSIVE METABOLIC PANEL - Abnormal; Notable for the following:    Sodium 136 (*)    Potassium 3.5 (*)    Chloride 95 (*)    Glucose, Bld 107 (*)    BUN 49 (*)    Creatinine, Ser 1.71 (*)    GFR calc non Af Amer 36 (*)    GFR calc Af Amer 42 (*)    All other components within normal limits  SEDIMENTATION RATE - Abnormal; Notable for the following:    Sed Rate 28 (*)    All other components within normal limits  COMPREHENSIVE METABOLIC PANEL - Abnormal; Notable for the following:    BUN 24 (*)    Calcium 8.3 (*)    Albumin 3.4 (*)    GFR calc non Af Amer 86 (*)    All other components within normal limits  CBC - Abnormal; Notable for the following:    WBC 11.0 (*)    HCT 35.7 (*)    All other components within normal limits  URINALYSIS, ROUTINE W REFLEX MICROSCOPIC - Abnormal; Notable for the following:    APPearance CLOUDY (*)    Hgb urine dipstick TRACE (*)    Leukocytes, UA LARGE (*)    All other components within normal limits  URINE MICROSCOPIC-ADD ON - Abnormal; Notable for the following:    Bacteria, UA FEW (*)    All other components within normal limits  LIPASE, BLOOD - Abnormal; Notable for the following:    Lipase 105 (*)     All other components within normal limits  CBC - Abnormal; Notable for the following:    RBC 3.76 (*)    Hemoglobin 11.6 (*)    HCT 32.8 (*)    All other components within normal limits  BASIC METABOLIC PANEL - Abnormal; Notable for the following:    Potassium 3.0 (*)    BUN 3 (*)    Creatinine, Ser 0.49 (*)    Calcium 8.2 (*)    All other components within  normal limits  LIPASE, BLOOD - Abnormal; Notable for the following:    Lipase 132 (*)    All other components within normal limits  GC/CHLAMYDIA PROBE AMP  URINE CULTURE  HCG, SERUM, QUALITATIVE   Imaging Review No results found.    MDM   Final diagnoses:  Vomiting  Acute kidney injury    Pelvic exam performed. There is some whitish yellow discharge , no feces noted. No sign of abnormal appearance of the cervix or vaginal vault.  Discussion:   she has signs of acute kidney injury with elevated BUN/creatinine. No sign of obstruction or fistula on CT. She is still symptomatic with nausea. The plan will be discussed with hospitalist regarding admission for emeticc control and rehydration.  Per the patient's request she was NOT given IV narcotics. Her pain is fairly/adequately controlled with Toradol and Robinul. Has been given multiple antiemetics. No additional vomiting, but remains nauseated.   Rolland Porter, MD 08/26/13 [phone]

## 2013-08-23 NOTE — Progress Notes (Signed)
Peripherally Inserted Central Catheter/Midline Placement  The IV Nurse has discussed with the patient and/or persons authorized to consent for the patient, the purpose of this procedure and the potential benefits and risks involved with this procedure.  The benefits include less needle sticks, lab draws from the catheter and patient may be discharged home with the catheter.  Risks include, but not limited to, infection, bleeding, blood clot (thrombus formation), and puncture of an artery; nerve damage and irregular heat beat.  Alternatives to this procedure were also discussed.  PICC/Midline Placement Documentation        Marthann Abshier, Lajean Manes 08/23/2013, 9:29 PM

## 2013-08-23 NOTE — H&P (Addendum)
[facility] History and Physical  Stephanie [name] BMW:[id number] DOB: 03/11/1971 DOA: 08/23/2013  Referring physician: EDP PCP: Thayer HeadingsMACKENZIE,BRIAN, MD   Chief Complaint: nausea, vomiting and abdo pain since wednesday  HPI: Stephanie [name] is a 43 y.o. female with prior h/o crohn's disease, s/p colectomy, and colostomy , came in for worsening nausea, vomiting and abdominal cramping since Wednesday. No fevers or chills. On arrival to ed, she was found to be dehydrated, labs revealed, leukocytosis, renal insufficiency. She is referred to medical service for management of the above symptoms. CT abd and pelvis did not reveal any inflammation.    Review of Systems:  Constitutional:  No weight loss, night sweats, Fevers, chills, fatigue.  HEENT:  No headaches, Difficulty swallowing,Tooth/dental problems,Sore throat,  No sneezing, itching, ear ache, nasal congestion, post nasal drip,  Cardio-vascular:  No chest pain, Orthopnea, PND, swelling in lower extremities, anasarca, dizziness, palpitations  GI:  Positive for nausea, vomiting and abdominal pain.  Resp:  No shortness of breath with exertion or at rest. No excess mucus, no productive cough, No non-productive cough, No coughing up of blood.No change in color of mucus.No wheezing.No chest wall deformity  Skin:  no rash or lesions.  GU:  no dysuria, change in color of urine, no urgency or frequency. No flank pain.  Musculoskeletal:  No joint pain or swelling. No decreased range of motion. No back pain.  Psych:  No change in mood or affect. No depression or anxiety. No memory loss.   Past Medical History  Diagnosis Date  . Crohn's disease   . GERD (gastroesophageal reflux disease)   . Abdominal pain   . Anxiety   . Migraine headache    Past Surgical History  Procedure Laterality Date  . Vagina surgery      rectovaginal fistula repair  . Colon surgery      removal of rectum, anus and entire large colon  . Colostomy      Social History:  reports that she quit smoking about 13 years ago. Her smoking use included Cigarettes. She smoked 0.00 packs per day for 10 years. She has never used smokeless tobacco. She reports that she does not drink alcohol or use illicit drugs.  Allergies  Allergen Reactions  . Flagyl [Metronidazole Hcl] Nausea And Vomiting  . Stadol [Butorphanol Tartrate] Other (See Comments)    Body feels hot, hallucinations     Family History  Problem Relation Age of Onset  . Depression Maternal Aunt   . Heart attack Paternal Uncle   . Stroke Maternal Grandmother [phone]  . Depression Maternal Grandmother   . Nephrolithiasis Other   . Other Mother     chrons disease  . Cancer Paternal Grandfather     throat  . Drug abuse Maternal Uncle   . Drug abuse Cousin     maternal  . Drug abuse Cousin     paternal     Prior to Admission medications   Medication Sig Start Date End Date Taking? Authorizing Provider  amLODipine (NORVASC) 5 MG tablet Take 5 mg by mouth daily.   Yes Historical Provider, MD  hydrochlorothiazide (HYDRODIURIL) 25 MG tablet Take 25 mg by mouth daily.   Yes Historical Provider, MD   Physical Exam: Filed Vitals:   08/23/13 1752  BP: 111/70  Pulse: 70  Temp: 97.8 F (36.6 C)  Resp: 18    BP 111/70  Pulse 70  Temp(Src) 97.8 F (36.6 C) (Oral)  Resp 18  Ht 5\' 6"  (1.676  m)  Wt 68.04 kg (150 lb)  BMI 24.22 kg/m2  SpO2 98%  LMP 08/07/2013  General:  Appears calm and comfortable Eyes: PERRL, normal lids, irises & conjunctiva ENT: grossly normal hearing, lips & tongue Neck: no LAD, masses or thyromegaly Cardiovascular: RRR, no m/r/g. No LE edema. Respiratory: CTA bilaterally, no w/r/r. Normal respiratory effort. Abdomen: soft, mild tenderness, not distended. bs+ Skin: no rash or induration seen on limited exam Musculoskeletal: grossly normal tone BUE/BLE Psychiatric: grossly normal mood and affect, speech fluent and appropriate Neurologic: grossly non-focal.           Labs on Admission:  Basic Metabolic Panel:  Recent Labs Lab 08/23/13 1412  NA 136*  K 3.5*  CL 95*  CO2 21  GLUCOSE 107*  BUN 49*  CREATININE 1.71*  CALCIUM 9.2   Liver Function Tests:  Recent Labs Lab 08/23/13 1412  AST 18  ALT 22  ALKPHOS 56  BILITOT 0.4  PROT 8.2  ALBUMIN 4.1   No results found for this basename: LIPASE, AMYLASE,  in the last 168 hours No results found for this basename: AMMONIA,  in the last 168 hours CBC:  Recent Labs Lab 08/23/13 1412  WBC 12.4*  NEUTROABS 10.3*  HGB 15.3*  HCT 41.6  MCV 86.5  PLT 305   Cardiac Enzymes: No results found for this basename: CKTOTAL, CKMB, CKMBINDEX, TROPONINI,  in the last 168 hours  BNP (last 3 results) No results found for this basename: PROBNP,  in the last 8760 hours CBG: No results found for this basename: GLUCAP,  in the last 168 hours  Radiological Exams on Admission: Ct Abdomen Pelvis W Contrast  08/23/2013   CLINICAL DATA:  Nausea and vomiting.  History of Crohn's disease  EXAM: CT ABDOMEN AND PELVIS WITH CONTRAST  TECHNIQUE: Multidetector CT imaging of the abdomen and pelvis was performed using the standard protocol following bolus administration of intravenous contrast.  CONTRAST:  15mL OMNIPAQUE IOHEXOL 300 MG/ML  SOLN  COMPARISON:  09/30/2011  FINDINGS: Lung bases are clear.  No pleural or pericardial effusion noted.  No suspicious liver abnormalities. The gallbladder appears normal. No biliary dilatation. Normal appearance of the pancreas. The spleen is unremarkable.  The adrenal glands are both normal. The right kidney is normal. The left kidney is normal. The urinary bladder appears within normal limits. The uterus appears normal. Cyst noted within the left ovary. There is gas/debris identified within the dome of the vagina, which is of uncertain significance.  Normal caliber of the abdominal aorta without evidence for aneurysm. There is no upper abdominal adenopathy. No pelvic or  inguinal adenopathy. There is no significant ascites within the abdomen or the pelvis.  The stomach appears normal. The small bowel loops have a normal course and caliber and there is no evidence for obstruction. The patient is status post colectomy and right lower quadrant ileostomy. No active inflammation identified at this time. No evidence for bowel perforation, or abscess formation.  Review of the visualized osseous structures is unremarkable. No worrisome lytic or sclerotic bone lesions identified.  IMPRESSION: 1. Previous colectomy with right lower quadrant ileostomy. 2. No evidence for active inflammation within the abdomen or pelvis. 3. Gas and debris is noted within the dome of the vagina which is of uncertain clinical significance.   Electronically Signed   By: Signa Kell M.D.   On: 08/23/2013 15:58      Assessment/Plan Active Problems:   Gastroenteritis   Nausea & vomiting   Nausea,  vomiting and abdominal pain:  - possibly secondary to viral gastroenteritis.  - does not appear to be crohn's flare up.  - start IV fluids, anti emetics, .  - IV protonix.  - clear liquid diet.  - CT abd and pelvis  Showed 1. Previous colectomy with right lower quadrant ileostomy. 2. No evidence for active inflammation within the abdomen or pelvis.   Acute renal failure: possibly from intravascular depletion.  - hydration and repeat labs in am.  - UA ordered. If renal parameters does not improve in am, will get renal ultrasound.    Crohn's disease:  - further management as outpatient.   Leukocytosis: - repeat cbc in am. - probably reactive.  - ua ordered. Not on any antibiotics.    Hypokalemia:  - replete as needed.   DVT prophylaxis.   Code Status: full code Family Communication: none at bedside Disposition Plan: pending.   Time spent: 55 min  [facility] Pager [phone]

## 2013-08-23 NOTE — ED Notes (Addendum)
Per EMS, Pt, from home, c/o generalized abdominal pain, nausea, and emesis x 2 days.  Pain score 10/10.  Hx of Crohn's disease and colostomy.   Pt would prefer to not be given narcotics.  Pt is also a hard IV stick.  EMS was unable to get a line.

## 2013-08-24 LAB — COMPREHENSIVE METABOLIC PANEL
ALBUMIN: 3.4 g/dL — AB (ref 3.5–5.2)
ALT: 17 U/L (ref 0–35)
AST: 14 U/L (ref 0–37)
Alkaline Phosphatase: 47 U/L (ref 39–117)
BUN: 24 mg/dL — ABNORMAL HIGH (ref 6–23)
CALCIUM: 8.3 mg/dL — AB (ref 8.4–10.5)
CO2: 25 meq/L (ref 19–32)
Chloride: 102 mEq/L (ref 96–112)
Creatinine, Ser: 0.85 mg/dL (ref 0.50–1.10)
GFR calc Af Amer: >90 mL/min (ref >90)
GFR, EST NON AFRICAN AMERICAN: 86 mL/min — AB (ref >90)
Glucose, Bld: 91 mg/dL (ref 70–99)
Potassium: 3.7 mEq/L (ref 3.7–5.3)
SODIUM: 139 meq/L (ref 137–147)
Total Bilirubin: 0.3 mg/dL (ref 0.3–1.2)
Total Protein: 7.1 g/dL (ref 6.0–8.3)

## 2013-08-24 LAB — CBC
HEMATOCRIT: 35.7 % — AB (ref 36.0–46.0)
HEMOGLOBIN: 12.8 g/dL (ref 12.0–15.0)
MCH: 31.3 pg (ref 26.0–34.0)
MCHC: 35.9 g/dL (ref 30.0–36.0)
MCV: 87.3 fL (ref 78.0–100.0)
Platelets: 351 10*3/uL (ref 150–400)
RBC: 4.09 MIL/uL (ref 3.87–5.11)
RDW: 13.2 % (ref 11.5–15.5)
WBC: 11 10*3/uL — ABNORMAL HIGH (ref 4.0–10.5)

## 2013-08-24 LAB — URINALYSIS, ROUTINE W REFLEX MICROSCOPIC
BILIRUBIN URINE: NEGATIVE
GLUCOSE, UA: NEGATIVE mg/dL
Ketones, ur: NEGATIVE mg/dL
Nitrite: NEGATIVE
PH: 6.5 (ref 5.0–8.0)
PROTEIN: NEGATIVE mg/dL
Specific Gravity, Urine: 1.021 (ref 1.005–1.030)
Urobilinogen, UA: 0.2 mg/dL (ref 0.0–1.0)

## 2013-08-24 LAB — GC/CHLAMYDIA PROBE AMP
CT Probe RNA: NEGATIVE
GC Probe RNA: NEGATIVE

## 2013-08-24 LAB — LIPASE, BLOOD: Lipase: 105 U/L — ABNORMAL HIGH (ref 11–59)

## 2013-08-24 LAB — URINE MICROSCOPIC-ADD ON

## 2013-08-24 MED ORDER — DEXTROSE 5 % IV SOLN
1.0000 g | Freq: Every day | INTRAVENOUS | Status: DC
  Administered 2013-08-24 – 2013-08-25 (×3): 1 g via INTRAVENOUS
  Filled 2013-08-24 (×3): qty 10

## 2013-08-24 MED ORDER — PROMETHAZINE HCL 25 MG/ML IJ SOLN
25.0000 mg | Freq: Four times a day (QID) | INTRAMUSCULAR | Status: DC | PRN
  Administered 2013-08-24 – 2013-08-28 (×16): 25 mg via INTRAVENOUS
  Filled 2013-08-24 (×16): qty 1

## 2013-08-24 MED ORDER — PROMETHAZINE HCL 25 MG/ML IJ SOLN
12.5000 mg | Freq: Four times a day (QID) | INTRAMUSCULAR | Status: DC | PRN
  Administered 2013-08-24: 12.5 mg via INTRAVENOUS
  Filled 2013-08-24: qty 1

## 2013-08-24 NOTE — Progress Notes (Signed)
[facility] PROGRESS NOTE  Ander Sladenna K Pelc XLK:[id number]:[id number] DOB: 08/24/1970 DOA: 08/23/2013 PCP: Thayer HeadingsMACKENZIE,BRIAN, MD  Assessment/Plan:  Nausea, vomiting and abdominal pain:  - possibly secondary to viral gastroenteritis.  - does not appear to be crohn's flare up.  - start IV fluids, anti emetics, .  - clear liquid diet and advance as tolerated.  - CT abd and pelvis Showed 1. Previous colectomy with right lower quadrant ileostomy. 2. No evidence for active inflammation within the abdomen or pelvis.  Acute renal failure: possibly from intravascular depletion.  - hydration and repeat labs in am show resolution of renal failure.  Crohn's disease:  - further management as outpatient.  Leukocytosis:  - repeat cbc in am.  - probably reactive.  Hypokalemia:  - replete as needed.   UTI;  - urine cultures pending - on rocephin.   DVT prophylaxis.  Code Status: full code  Family Communication: none at bedside  Disposition Plan: pending.    Consultants:  none  Procedures:  CT abd pelvis  Antibiotics:  Rocephin 2/20  HPI/Subjective: Nauseated.  Objective: Filed Vitals:   08/24/13 0436  BP: 115/76  Pulse: 84  Temp: 97.5 F (36.4 C)  Resp: 18    Intake/Output Summary (Last 24 hours) at 08/24/13 1501 Last data filed at 08/24/13 1456  Gross per 24 hour  Intake 3936.5 ml  Output   2150 ml  Net 1786.5 ml   Filed Weights   08/23/13 1752  Weight: 68.04 kg (150 lb)    Exam:   General:  Alert afebrile comfortable  Cardiovascular: s1s2  Respiratory: ctab   Abdomen: soft MILD tenderness  Musculoskeletal: nopedal edema  Data Reviewed: Basic Metabolic Panel:  Recent Labs Lab 08/23/13 1412 08/24/13 0400  NA 136* 139  K 3.5* 3.7  CL 95* 102  CO2 21 25  GLUCOSE 107* 91  BUN 49* 24*  CREATININE 1.71* 0.85  CALCIUM 9.2 8.3*   Liver Function Tests:  Recent Labs Lab 08/23/13 1412 08/24/13 0400  AST 18 14  ALT 22 17  ALKPHOS 56 47  BILITOT  0.4 0.3  PROT 8.2 7.1  ALBUMIN 4.1 3.4*    Recent Labs Lab 08/24/13 0400  LIPASE 105*   No results found for this basename: AMMONIA,  in the last 168 hours CBC:  Recent Labs Lab 08/23/13 1412 08/24/13 0400  WBC 12.4* 11.0*  NEUTROABS 10.3*  --   HGB 15.3* 12.8  HCT 41.6 35.7*  MCV 86.5 87.3  PLT 305 351   Cardiac Enzymes: No results found for this basename: CKTOTAL, CKMB, CKMBINDEX, TROPONINI,  in the last 168 hours BNP (last 3 results) No results found for this basename: PROBNP,  in the last 8760 hours CBG: No results found for this basename: GLUCAP,  in the last 168 hours  Recent Results (from the past 240 hour(s))  GC/CHLAMYDIA PROBE AMP     Status: None   Collection Time    08/23/13  4:25 PM      Result Value Ref Range Status   CT Probe RNA NEGATIVE  NEGATIVE Final   GC Probe RNA NEGATIVE  NEGATIVE Final   Comment: (NOTE)                                                                                               **  Normal Reference Range: Negative**          Assay performed using the Gen-Probe APTIMA COMBO2 (R) Assay.     Acceptable specimen types for this assay include APTIMA Swabs (Unisex,     endocervical, urethral, or vaginal), first void urine, and ThinPrep     liquid based cytology samples.     Performed at [facility]  WET PREP, GENITAL     Status: Abnormal   Collection Time    08/23/13  4:25 PM      Result Value Ref Range Status   Yeast Wet Prep HPF POC NONE SEEN  NONE SEEN Final   Trich, Wet Prep NONE SEEN  NONE SEEN Final   Clue Cells Wet Prep HPF POC NONE SEEN  NONE SEEN Final   WBC, Wet Prep HPF POC MODERATE (*) NONE SEEN Final     Studies: Ct Abdomen Pelvis W Contrast  08/23/2013   CLINICAL DATA:  Nausea and vomiting.  History of Crohn's disease  EXAM: CT ABDOMEN AND PELVIS WITH CONTRAST  TECHNIQUE: Multidetector CT imaging of the abdomen and pelvis was performed using the standard protocol following bolus administration of  intravenous contrast.  CONTRAST:  80mL OMNIPAQUE IOHEXOL 300 MG/ML  SOLN  COMPARISON:  09/30/2011  FINDINGS: Lung bases are clear.  No pleural or pericardial effusion noted.  No suspicious liver abnormalities. The gallbladder appears normal. No biliary dilatation. Normal appearance of the pancreas. The spleen is unremarkable.  The adrenal glands are both normal. The right kidney is normal. The left kidney is normal. The urinary bladder appears within normal limits. The uterus appears normal. Cyst noted within the left ovary. There is gas/debris identified within the dome of the vagina, which is of uncertain significance.  Normal caliber of the abdominal aorta without evidence for aneurysm. There is no upper abdominal adenopathy. No pelvic or inguinal adenopathy. There is no significant ascites within the abdomen or the pelvis.  The stomach appears normal. The small bowel loops have a normal course and caliber and there is no evidence for obstruction. The patient is status post colectomy and right lower quadrant ileostomy. No active inflammation identified at this time. No evidence for bowel perforation, or abscess formation.  Review of the visualized osseous structures is unremarkable. No worrisome lytic or sclerotic bone lesions identified.  IMPRESSION: 1. Previous colectomy with right lower quadrant ileostomy. 2. No evidence for active inflammation within the abdomen or pelvis. 3. Gas and debris is noted within the dome of the vagina which is of uncertain clinical significance.   Electronically Signed   By: Signa Kell M.D.   On: 08/23/2013 15:58    Scheduled Meds: . amLODipine  5 mg Oral Daily  . cefTRIAXone (ROCEPHIN)  IV  1 g Intravenous QHS  . enoxaparin (LOVENOX) injection  40 mg Subcutaneous Q24H  . sodium chloride  10-40 mL Intracatheter Q12H   Continuous Infusions: . sodium chloride 125 mL/hr at 08/24/13 0605    Active Problems:   Crohn disease   Gastroenteritis   Nausea & vomiting    Acute renal failure   Leukocytosis, unspecified    Time spent: 25 min    Derion Kreiter  [facility] Pager [phone]. If 7PM-7AM, please contact night-coverage at [email or web address], password [email or web address] 08/24/2013, 3:01 PM  LOS: 1 day

## 2013-08-25 DIAGNOSIS — K859 Acute pancreatitis without necrosis or infection, unspecified: Secondary | ICD-10-CM | POA: Diagnosis present

## 2013-08-25 LAB — URINE CULTURE

## 2013-08-25 MED ORDER — TRAMADOL HCL 50 MG PO TABS
50.0000 mg | ORAL_TABLET | Freq: Once | ORAL | Status: AC
  Administered 2013-08-25: 50 mg via ORAL

## 2013-08-25 MED ORDER — PANTOPRAZOLE SODIUM 40 MG PO TBEC
40.0000 mg | DELAYED_RELEASE_TABLET | Freq: Every day | ORAL | Status: DC
  Administered 2013-08-25 – 2013-08-28 (×4): 40 mg via ORAL
  Filled 2013-08-25 (×5): qty 1

## 2013-08-25 NOTE — Progress Notes (Signed)
Nutrition Brief Note  Patient identified on the Malnutrition Screening Tool (MST) Report  Wt Readings from Last 15 Encounters:  08/23/13 150 lb (68.04 kg)  12/08/11 173 lb (78.472 kg)  12/07/11 188 lb (85.276 kg)  09/08/11 188 lb (85.276 kg)  06/13/11 193 lb 11.2 oz (87.862 kg)    Body mass index is 24.22 kg/(m^2). Patient meets criteria for normal body weight based on current BMI.   Current diet order is full liquids. Labs and medications reviewed.   Pt reports that she is feeling better and her nausea is subsiding. She did fine with her clear liquids, and has been upgraded to full liquids. Pt feels that she does not need any nutritional supplementation at this time.  Recommend continuing to advance diet as tolerated.   No nutrition interventions warranted at this time. If nutrition issues arise, please consult RD.   Stephanie [name] RD, LDN

## 2013-08-25 NOTE — Progress Notes (Signed)
[facility] PROGRESS NOTE  Stephanie [name] OLM:[id number] DOB: 1970-07-25 DOA: 08/23/2013 PCP: Thayer Headings, MD  Assessment/Plan:  Nausea, vomiting and abdominal pain:  - possibly secondary to viral gastroenteritis and pancreatitis.  - does not appear to be crohn's flare up.  - start IV fluids, anti emetics, .  - clear liquid diet and advance as tolerated.  - CT abd and pelvis Showed 1. Previous colectomy with right lower quadrant ileostomy. 2. No evidence for active inflammation within the abdomen or pelvis.  - lipase levels aremildly elevated. Recheck them in am.  - decrease fluid rate.  Acute renal failure: possibly from intravascular depletion.  - hydration and repeat labs in am show resolution of renal failure.  Crohn's disease:  - further management as outpatient.  Leukocytosis:  - repeat cbc in am.  - probably reactive.  Hypokalemia:  - replete as needed.   UTI;  - urine cultures pending - on rocephin.   DVT prophylaxis.  Code Status: full code  Family Communication: none at bedside  Disposition Plan: pending.    Consultants:  none  Procedures:  CT abd pelvis  Antibiotics:  Rocephin 2/20  HPI/Subjective: Nauseated.no vomiting , mild epigastric pain.   Objective: Filed Vitals:   08/25/13 1355  BP: 128/87  Pulse: 81  Temp:   Resp:     Intake/Output Summary (Last 24 hours) at 08/25/13 1644 Last data filed at 08/25/13 1421  Gross per 24 hour  Intake   1640 ml  Output   3200 ml  Net  -1560 ml   Filed Weights   08/23/13 1752  Weight: 68.04 kg (150 lb)    Exam:   General:  Alert afebrile comfortable  Cardiovascular: s1s2  Respiratory: ctab   Abdomen: soft MILD tenderness in the epigastric area.   Musculoskeletal: no pedal edema, left hand and arm edematous.   Data Reviewed: Basic Metabolic Panel:  Recent Labs Lab 08/23/13 1412 08/24/13 0400  NA 136* 139  K 3.5* 3.7  CL 95* 102  CO2 21 25  GLUCOSE 107* 91  BUN 49*  24*  CREATININE 1.71* 0.85  CALCIUM 9.2 8.3*   Liver Function Tests:  Recent Labs Lab 08/23/13 1412 08/24/13 0400  AST 18 14  ALT 22 17  ALKPHOS 56 47  BILITOT 0.4 0.3  PROT 8.2 7.1  ALBUMIN 4.1 3.4*    Recent Labs Lab 08/24/13 0400  LIPASE 105*   No results found for this basename: AMMONIA,  in the last 168 hours CBC:  Recent Labs Lab 08/23/13 1412 08/24/13 0400  WBC 12.4* 11.0*  NEUTROABS 10.3*  --   HGB 15.3* 12.8  HCT 41.6 35.7*  MCV 86.5 87.3  PLT 305 351   Cardiac Enzymes: No results found for this basename: CKTOTAL, CKMB, CKMBINDEX, TROPONINI,  in the last 168 hours BNP (last 3 results) No results found for this basename: PROBNP,  in the last 8760 hours CBG: No results found for this basename: GLUCAP,  in the last 168 hours  Recent Results (from the past 240 hour(s))  GC/CHLAMYDIA PROBE AMP     Status: None   Collection Time    08/23/13  4:25 PM      Result Value Ref Range Status   CT Probe RNA NEGATIVE  NEGATIVE Final   GC Probe RNA NEGATIVE  NEGATIVE Final   Comment: (NOTE)                                                                                               **  Normal Reference Range: Negative**          Assay performed using the Gen-Probe APTIMA COMBO2 (R) Assay.     Acceptable specimen types for this assay include APTIMA Swabs (Unisex,     endocervical, urethral, or vaginal), first void urine, and ThinPrep     liquid based cytology samples.     [facility]  WET PREP, GENITAL     Status: Abnormal   Collection Time    08/23/13  4:25 PM      Result Value Ref Range Status   Yeast Wet Prep HPF POC NONE SEEN  NONE SEEN Final   Trich, Wet Prep NONE SEEN  NONE SEEN Final   Clue Cells Wet Prep HPF POC NONE SEEN  NONE SEEN Final   WBC, Wet Prep HPF POC MODERATE (*) NONE SEEN Final     Studies: No results found.  Scheduled Meds: . amLODipine  5 mg Oral Daily  . cefTRIAXone (ROCEPHIN)  IV  1 g Intravenous QHS  .  enoxaparin (LOVENOX) injection  40 mg Subcutaneous Q24H  . pantoprazole  40 mg Oral Q0600  . sodium chloride  10-40 mL Intracatheter Q12H   Continuous Infusions: . sodium chloride 50 mL/hr at 08/25/13 1023    Active Problems:   Crohn disease   Gastroenteritis   Nausea & vomiting   Acute renal failure   Leukocytosis, unspecified    Time spent: 25 min    Abed Schar  [facility] Pager [phone]. If 7PM-7AM, please contact night-coverage at [email or web address], password [email or web address] 08/25/2013, 4:44 PM  LOS: 2 days

## 2013-08-26 DIAGNOSIS — K5090 Crohn's disease, unspecified, without complications: Secondary | ICD-10-CM

## 2013-08-26 LAB — POTASSIUM: Potassium: 3.6 mEq/L — ABNORMAL LOW (ref 3.7–5.3)

## 2013-08-26 LAB — BASIC METABOLIC PANEL
BUN: 3 mg/dL — AB (ref 6–23)
CHLORIDE: 102 meq/L (ref 96–112)
CO2: 30 meq/L (ref 19–32)
CREATININE: 0.49 mg/dL — AB (ref 0.50–1.10)
Calcium: 8.2 mg/dL — ABNORMAL LOW (ref 8.4–10.5)
GFR calc Af Amer: >90 mL/min (ref >90)
GFR calc non Af Amer: >90 mL/min (ref >90)
Glucose, Bld: 84 mg/dL (ref 70–99)
Potassium: 3 mEq/L — ABNORMAL LOW (ref 3.7–5.3)
Sodium: 141 mEq/L (ref 137–147)

## 2013-08-26 LAB — CBC
HEMATOCRIT: 32.8 % — AB (ref 36.0–46.0)
Hemoglobin: 11.6 g/dL — ABNORMAL LOW (ref 12.0–15.0)
MCH: 30.9 pg (ref 26.0–34.0)
MCHC: 35.4 g/dL (ref 30.0–36.0)
MCV: 87.2 fL (ref 78.0–100.0)
Platelets: 279 10*3/uL (ref 150–400)
RBC: 3.76 MIL/uL — AB (ref 3.87–5.11)
RDW: 12.7 % (ref 11.5–15.5)
WBC: 5.5 10*3/uL (ref 4.0–10.5)

## 2013-08-26 LAB — LIPASE, BLOOD: Lipase: 132 U/L — ABNORMAL HIGH (ref 11–59)

## 2013-08-26 MED ORDER — POTASSIUM CHLORIDE CRYS ER 20 MEQ PO TBCR
40.0000 meq | EXTENDED_RELEASE_TABLET | Freq: Three times a day (TID) | ORAL | Status: AC
  Administered 2013-08-26 (×2): 40 meq via ORAL
  Filled 2013-08-26 (×2): qty 2

## 2013-08-26 MED ORDER — HYDROCHLOROTHIAZIDE 25 MG PO TABS
25.0000 mg | ORAL_TABLET | Freq: Once | ORAL | Status: AC
  Administered 2013-08-26: 25 mg via ORAL
  Filled 2013-08-26: qty 1

## 2013-08-26 NOTE — Progress Notes (Signed)
[facility] PROGRESS NOTE  Stephanie [name] WUJ:[id number]:[id number] DOB: 01/01/1971 DOA: 08/23/2013 PCP: Thayer HeadingsMACKENZIE,BRIAN, MD  Assessment/Plan:  Nausea, vomiting and abdominal pain:  - possibly secondary to viral gastroenteritis and pancreatitis.  - does not appear to be crohn's flare up.  - started IV fluids, anti emetics, .  - clear liquid diet and advance as tolerated.  - CT abd and pelvis Showed 1. Previous colectomy with right lower quadrant ileostomy. 2. No evidence for active inflammation within the abdomen or pelvis.  - lipase levels are mildly elevated. Recheck them in am.  - her symptoms improved except for some epigastric pain.  - d/c home when abd pain improves.   Acute renal failure: possibly from intravascular depletion.  - hydration and repeat labs in am show resolution of renal failure.  Crohn's disease:  - further management as outpatient.  Leukocytosis:  - repeat cbc in am.  - probably reactive.  Hypokalemia:  - replete as needed.   UTI;  - urine cultures show lactobacillus.  - rocephin discontinued.   DVT prophylaxis.  Code Status: full code  Family Communication: none at bedside  Disposition Plan: pending.    Consultants:  none  Procedures:  CT abd pelvis  Antibiotics:  Rocephin 2/20  HPI/Subjective: Nauseated.no vomiting , mild epigastric pain.   Objective: Filed Vitals:   08/26/13 1424  BP: 109/74  Pulse: 80  Temp: 98.2 F (36.8 C)  Resp: 18    Intake/Output Summary (Last 24 hours) at 08/26/13 1817 Last data filed at 08/26/13 1406  Gross per 24 hour  Intake 1672.5 ml  Output   1650 ml  Net   22.5 ml   Filed Weights   08/23/13 1752  Weight: 68.04 kg (150 lb)    Exam:   General:  Alert afebrile comfortable  Cardiovascular: s1s2  Respiratory: ctab   Abdomen: soft MILD tenderness in the epigastric area.   Musculoskeletal: no pedal edema, left hand and arm edematous.   Data Reviewed: Basic Metabolic Panel:  Recent  Labs Lab 08/23/13 1412 08/24/13 0400 08/26/13 0615 08/26/13 1300  NA 136* 139 141  --   K 3.5* 3.7 3.0* 3.6*  CL 95* 102 102  --   CO2 21 25 30   --   GLUCOSE 107* 91 84  --   BUN 49* 24* 3*  --   CREATININE 1.71* 0.85 0.49*  --   CALCIUM 9.2 8.3* 8.2*  --    Liver Function Tests:  Recent Labs Lab 08/23/13 1412 08/24/13 0400  AST 18 14  ALT 22 17  ALKPHOS 56 47  BILITOT 0.4 0.3  PROT 8.2 7.1  ALBUMIN 4.1 3.4*    Recent Labs Lab 08/24/13 0400 08/26/13 0615  LIPASE 105* 132*   No results found for this basename: AMMONIA,  in the last 168 hours CBC:  Recent Labs Lab 08/23/13 1412 08/24/13 0400 08/26/13 0615  WBC 12.4* 11.0* 5.5  NEUTROABS 10.3*  --   --   HGB 15.3* 12.8 11.6*  HCT 41.6 35.7* 32.8*  MCV 86.5 87.3 87.2  PLT 305 351 279   Cardiac Enzymes: No results found for this basename: CKTOTAL, CKMB, CKMBINDEX, TROPONINI,  in the last 168 hours BNP (last 3 results) No results found for this basename: PROBNP,  in the last 8760 hours CBG: No results found for this basename: GLUCAP,  in the last 168 hours  Recent Results (from the past 240 hour(s))  GC/CHLAMYDIA PROBE AMP     Status: None  Collection Time    08/23/13  4:25 PM      Result Value Ref Range Status   CT Probe RNA NEGATIVE  NEGATIVE Final   GC Probe RNA NEGATIVE  NEGATIVE Final   Comment: (NOTE)                                                                                               **Normal Reference Range: Negative**          Assay performed using the Gen-Probe APTIMA COMBO2 (R) Assay.     Acceptable specimen types for this assay include APTIMA Swabs (Unisex,     endocervical, urethral, or vaginal), first void urine, and ThinPrep     liquid based cytology samples.     Performed at [facility]  WET PREP, GENITAL     Status: Abnormal   Collection Time    08/23/13  4:25 PM      Result Value Ref Range Status   Yeast Wet Prep HPF POC NONE SEEN  NONE SEEN Final   Trich, Wet  Prep NONE SEEN  NONE SEEN Final   Clue Cells Wet Prep HPF POC NONE SEEN  NONE SEEN Final   WBC, Wet Prep HPF POC MODERATE (*) NONE SEEN Final  URINE CULTURE     Status: None   Collection Time    08/24/13 12:39 AM      Result Value Ref Range Status   Specimen Description URINE, CLEAN CATCH   Final   Special Requests NONE   Final   Culture  Setup Time     Final   Value: 08/24/2013 17:48     Performed at [facility] Count     Final   Value: 45,000 COLONIES/ML     Performed at [facility]   Culture     Final   Value: LACTOBACILLUS SPECIES     Note: Standardized susceptibility testing for this organism is not available.     Performed at [facility]   Report Status 08/25/2013 FINAL   Final     Studies: No results found.  Scheduled Meds: . amLODipine  5 mg Oral Daily  . enoxaparin (LOVENOX) injection  40 mg Subcutaneous Q24H  . pantoprazole  40 mg Oral Q0600  . sodium chloride  10-40 mL Intracatheter Q12H   Continuous Infusions:    Active Problems:   Crohn disease   Gastroenteritis   Nausea & vomiting   Acute renal failure   Leukocytosis, unspecified   Pancreatitis, acute    Time spent: 25 min    Camauri Craton  [facility] Pager [phone]. If 7PM-7AM, please contact night-coverage at [email or web address], password [email or web address] 08/26/2013, 6:17 PM  LOS: 3 days

## 2013-08-26 NOTE — Care Management Note (Signed)
   CARE MANAGEMENT NOTE 08/26/2013  Patient:  CATHARINE, ITKIN   Account Number:  [id number]  Date Initiated:  08/26/2013  Documentation initiated by:  Farhiya Rosten  Subjective/Objective Assessment:   43 yo female admitted with N/V, abdominal pain     Action/Plan:   Home when stable   Anticipated DC Date:     Anticipated DC Plan:  HOME/SELF CARE      DC Planning Services  CM consult      Choice offered to / List presented to:  NA   DME arranged  NA      DME agency  NA     HH arranged  NA      HH agency  NA   Status of service:  Completed, signed off Medicare Important Message given?   (If response is "NO", the following Medicare IM given date fields will be blank) Date Medicare IM given:   Date Additional Medicare IM given:    Discharge Disposition:    Per UR Regulation:  Reviewed for med. necessity/level of care/duration of stay  If discussed at Long Length of Stay Meetings, dates discussed:    Comments:  08/26/13 1530 Kelechi Astarita,MSN,RN [phone] Chart reviewed for utilization of services. No needs identified at this time.

## 2013-08-27 ENCOUNTER — Inpatient Hospital Stay (HOSPITAL_COMMUNITY): Payer: BC Managed Care – PPO

## 2013-08-27 DIAGNOSIS — K859 Acute pancreatitis without necrosis or infection, unspecified: Secondary | ICD-10-CM

## 2013-08-27 LAB — BASIC METABOLIC PANEL
BUN: 9 mg/dL (ref 6–23)
CO2: 29 mEq/L (ref 19–32)
Calcium: 8.9 mg/dL (ref 8.4–10.5)
Chloride: 98 mEq/L (ref 96–112)
Creatinine, Ser: 0.55 mg/dL (ref 0.50–1.10)
GFR calc Af Amer: >90 mL/min (ref >90)
GFR calc non Af Amer: >90 mL/min (ref >90)
Glucose, Bld: 115 mg/dL — ABNORMAL HIGH (ref 70–99)
Potassium: 3.2 mEq/L — ABNORMAL LOW (ref 3.7–5.3)
Sodium: 139 mEq/L (ref 137–147)

## 2013-08-27 LAB — LIPASE, BLOOD: Lipase: 193 U/L — ABNORMAL HIGH (ref 11–59)

## 2013-08-27 MED ORDER — SODIUM CHLORIDE 0.9 % IV SOLN
INTRAVENOUS | Status: DC
  Administered 2013-08-27 – 2013-08-28 (×2): via INTRAVENOUS

## 2013-08-27 NOTE — Progress Notes (Signed)
[facility] PROGRESS NOTE  Stephanie Sladenna K Lame RUE:[id number] DOB: 06/24/1971 DOA: 08/23/2013 PCP: Thayer HeadingsMACKENZIE,BRIAN, MD Brief HPI: Stephanie [name] is a 43 y.o. female with prior h/o crohn's disease, s/p colectomy, and colostomy , came in for worsening nausea, vomiting and abdominal cramping since Wednesday. No fevers or chills. On arrival to ed, she was found to be dehydrated, labs revealed, leukocytosis, renal insufficiency. She is referred to medical service for management of the above symptoms. CT abd and pelvis did not reveal any inflammation. On day 2 , her epigastric pain hasn't improved, and I ordered lipase levels which came back elevated. We have advanced her diet on 2/23, but her pain and nausea returned. She was made NPO this am, and started on IV fluids.  US abdomen ordered and was normal.    Assessment/Plan:  Nausea, vomiting and abdominal pain:  - possibly secondary to viral gastroenteritis and pancreatitis.  - does not appear to be crohn's flare up.  - started IV fluids, anti emetics, .  - clear liquid diet and advance as tolerated.  - CT abd and pelvis Showed 1. Previous colectomy with right lower quadrant ileostomy. 2. No evidence for active inflammation within the abdomen or pelvis.  - lipase levels are mildly elevated. Recheck them in am.  - her symptoms improved except for some epigastric pain.  - US abdomen did not reveal pancreatitis, show cholelithiasis.  - d/c home when abd pain improves.   Acute renal failure: possibly from intravascular depletion.  - hydration and repeat labs in am show resolution of renal failure.  Crohn's disease:  - further management as outpatient.  Leukocytosis:  - repeat cbc in am.  - probably reactive.  Hypokalemia:  - replete as needed.   Abnormal UA.  - urine cultures show lactobacillus.  - rocephin discontinued.   DVT prophylaxis.  Code Status: full code  Family Communication: none at bedside  Disposition Plan: pending.     Consultants:  none  Procedures:  CT abd pelvis  Antibiotics:  Rocephin 2/20  HPI/Subjective: Nauseated.no vomiting , mild epigastric pain.   Objective: Filed Vitals:   08/27/13 1513  BP: 115/79  Pulse: 83  Temp: 98 F (36.7 C)  Resp: 20    Intake/Output Summary (Last 24 hours) at 08/27/13 1847 Last data filed at 08/27/13 1516  Gross per 24 hour  Intake    730 ml  Output   1300 ml  Net   -570 ml   Filed Weights   08/23/13 1752  Weight: 68.04 kg (150 lb)    Exam:   General:  Alert afebrile comfortable  Cardiovascular: s1s2  Respiratory: ctab   Abdomen: soft MILD tenderness in the epigastric area.   Musculoskeletal: no pedal edema, left hand and arm edematous.   Data Reviewed: Basic Metabolic Panel:  Recent Labs Lab 08/23/13 1412 08/24/13 0400 08/26/13 0615 08/26/13 1300 08/27/13 0615  NA 136* 139 141  --  139  K 3.5* 3.7 3.0* 3.6* 3.2*  CL 95* 102 102  --  98  CO2 21 25 30   --  29  GLUCOSE 107* 91 84  --  115*  BUN 49* 24* 3*  --  9  CREATININE 1.71* 0.85 0.49*  --  0.55  CALCIUM 9.2 8.3* 8.2*  --  8.9   Liver Function Tests:  Recent Labs Lab 08/23/13 1412 08/24/13 0400  AST 18 14  ALT 22 17  ALKPHOS 56 47  BILITOT 0.4 0.3  PROT 8.2 7.1  ALBUMIN 4.1  3.4*    Recent Labs Lab 08/24/13 0400 08/26/13 0615 08/27/13 0615  LIPASE 105* 132* 193*   No results found for this basename: AMMONIA,  in the last 168 hours CBC:  Recent Labs Lab 08/23/13 1412 08/24/13 0400 08/26/13 0615  WBC 12.4* 11.0* 5.5  NEUTROABS 10.3*  --   --   HGB 15.3* 12.8 11.6*  HCT 41.6 35.7* 32.8*  MCV 86.5 87.3 87.2  PLT 305 351 279   Cardiac Enzymes: No results found for this basename: CKTOTAL, CKMB, CKMBINDEX, TROPONINI,  in the last 168 hours BNP (last 3 results) No results found for this basename: PROBNP,  in the last 8760 hours CBG: No results found for this basename: GLUCAP,  in the last 168 hours  Recent Results (from the past 240  hour(s))  GC/CHLAMYDIA PROBE AMP     Status: None   Collection Time    08/23/13  4:25 PM      Result Value Ref Range Status   CT Probe RNA NEGATIVE  NEGATIVE Final   GC Probe RNA NEGATIVE  NEGATIVE Final   Comment: (NOTE)                                                                                               **Normal Reference Range: Negative**          Assay performed using the Gen-Probe APTIMA COMBO2 (R) Assay.     Acceptable specimen types for this assay include APTIMA Swabs (Unisex,     endocervical, urethral, or vaginal), first void urine, and ThinPrep     liquid based cytology samples.     Performed at [facility]  WET PREP, GENITAL     Status: Abnormal   Collection Time    08/23/13  4:25 PM      Result Value Ref Range Status   Yeast Wet Prep HPF POC NONE SEEN  NONE SEEN Final   Trich, Wet Prep NONE SEEN  NONE SEEN Final   Clue Cells Wet Prep HPF POC NONE SEEN  NONE SEEN Final   WBC, Wet Prep HPF POC MODERATE (*) NONE SEEN Final  URINE CULTURE     Status: None   Collection Time    08/24/13 12:39 AM      Result Value Ref Range Status   Specimen Description URINE, CLEAN CATCH   Final   Special Requests NONE   Final   Culture  Setup Time     Final   Value: 08/24/2013 17:48     Performed at [facility] Count     Final   Value: 45,000 COLONIES/ML     Performed at [facility]   Culture     Final   Value: LACTOBACILLUS SPECIES     Note: Standardized susceptibility testing for this organism is not available.     Performed at [facility]   Report Status 08/25/2013 FINAL   Final     Studies: US Abdomen Complete  08/27/2013   CLINICAL DATA:  Pancreatitis  EXAM: ULTRASOUND ABDOMEN COMPLETE  COMPARISON:  CT abdomen pelvis 08/23/2013  FINDINGS: Gallbladder:  Contracted nearly filled with shadowing calculi up to 16 mm diameter. No gallbladder wall thickening, pericholecystic fluid or sonographic Murphy sign.  Common bile duct:   Diameter: Normal caliber 4 mm diameter  Liver:  Normal appearance  IVC:  Normal appearance  Pancreas:  Normal appearance  Spleen:  Normal appearance, 6.8 cm length  Right Kidney:  Length: 12.0 cm.  Normal morphology without mass or hydronephrosis.  Left Kidney:  Length: 10.9 cm.  Normal morphology without mass or hydronephrosis.  Abdominal aorta:  Normal caliber  Other findings:  No free-fluid  IMPRESSION: Cholelithiasis.  No evidence acute cholecystitis or biliary obstruction.  Remainder of exam unremarkable.   Electronically Signed   By: Ulyses Southward M.D.   On: 08/27/2013 11:01    Scheduled Meds: . amLODipine  5 mg Oral Daily  . enoxaparin (LOVENOX) injection  40 mg Subcutaneous Q24H  . pantoprazole  40 mg Oral Q0600  . sodium chloride  10-40 mL Intracatheter Q12H   Continuous Infusions: . sodium chloride 75 mL/hr at 08/27/13 1555    Active Problems:   Crohn disease   Gastroenteritis   Nausea & vomiting   Acute renal failure   Leukocytosis, unspecified   Pancreatitis, acute    Time spent: 25 min    Argie Lober  [facility] Pager [phone]. If 7PM-7AM, please contact night-coverage at [email or web address], password [facility] 08/27/2013, 6:47 PM  LOS: 4 days

## 2013-08-28 DIAGNOSIS — R112 Nausea with vomiting, unspecified: Secondary | ICD-10-CM

## 2013-08-28 LAB — BASIC METABOLIC PANEL
BUN: 5 mg/dL — AB (ref 6–23)
CHLORIDE: 102 meq/L (ref 96–112)
CO2: 27 mEq/L (ref 19–32)
Calcium: 8.6 mg/dL (ref 8.4–10.5)
Creatinine, Ser: 0.5 mg/dL (ref 0.50–1.10)
GFR calc non Af Amer: >90 mL/min (ref >90)
GLUCOSE: 90 mg/dL (ref 70–99)
Potassium: 3.6 mEq/L — ABNORMAL LOW (ref 3.7–5.3)
Sodium: 141 mEq/L (ref 137–147)

## 2013-08-28 LAB — LIPASE, BLOOD: Lipase: 148 U/L — ABNORMAL HIGH (ref 11–59)

## 2013-08-28 MED ORDER — PROMETHAZINE HCL 12.5 MG PO TABS: 12.5000 mg | ORAL_TABLET | Freq: Four times a day (QID) | ORAL | Status: AC | PRN

## 2013-08-28 MED ORDER — TRAMADOL HCL 50 MG PO TABS: 50.0000 mg | ORAL_TABLET | Freq: Four times a day (QID) | ORAL | Status: DC | PRN

## 2013-08-28 MED ORDER — PROMETHAZINE HCL 25 MG/ML IJ SOLN
25.0000 mg | Freq: Once | INTRAMUSCULAR | Status: AC
  Administered 2013-08-28: 25 mg via INTRAVENOUS
  Filled 2013-08-28: qty 1

## 2013-08-28 NOTE — Progress Notes (Signed)
Patient was stable at time of discharge. Reviewed discharge education with patient. She verbalized understanding. Patient left with prescriptions for tramadol and phenergan. Lama MD clarified over phone that he was calling in prescription for lasix to patient's pharmacy.

## 2013-08-28 NOTE — Discharge Summary (Signed)
Physician Discharge Summary  Stephanie [name] ZOX:[id number] DOB: 1971/01/24 DOA: 08/23/2013  PCP: Thayer Headings, MD  Admit date: 08/23/2013 Discharge date: 08/28/2013  Time spent: 50* minutes  Recommendations for Outpatient Follow-up:  1. Follow up PCP in 2 weeks  Discharge Diagnoses:  Active Problems:   Crohn disease   Gastroenteritis   Nausea & vomiting   Acute renal failure   Leukocytosis, unspecified   Pancreatitis, acute   Discharge Condition: Stable  Diet recommendation: Low fat diet  Filed Weights   08/23/13 1752  Weight: 68.04 kg (150 lb)    History of present illness:  43 y.o. female with prior h/o crohn's disease, s/p colectomy, and colostomy , came in for worsening nausea, vomiting and abdominal cramping since Wednesday. No fevers or chills. On arrival to ed, she was found to be dehydrated, labs revealed, leukocytosis, renal insufficiency. She is referred to medical service for management of the above symptoms. CT abd and pelvis did not reveal any inflammation. On day 2 , her epigastric pain hasn't improved, and I ordered lipase levels which came back elevated. We have advanced her diet on 2/23, but her pain and nausea returned. She was made NPO this am, and started on IV fluids. US abdomen ordered and was normal  Hospital Course:   Nausea, vomiting and abdominal pain:  - possibly secondary to viral gastroenteritis and pancreatitis.  - does not appear to be crohn's flare up.  - started IV fluids, anti emetics, .  - clear liquid diet and diet was advanced - CT abd and pelvis Showed 1. Previous colectomy with right lower quadrant ileostomy. 2. No evidence for active inflammation within the abdomen or pelvis.  - lipase levels are mildly elevated. Recheck them in am.  - her symptoms improved except for some epigastric pain.  - US abdomen did not reveal pancreatitis, show cholelithiasis.  - Will d/c home today on Po Tramadol prn along with phenergan prn for nausea,  vomiting - Will d/c hydrochlorothiazide at this time as it can cause pancreatitis  Acute renal failure: possibly from intravascular depletion.  - hydration and repeat labs in am show resolution of renal failure.   Crohn's disease:  - further management as outpatient.   Leukocytosis resolved  Abnormal UA.  - urine cultures show lactobacillus.  - rocephin discontinued.  - no antibiotics needed  Hypertension - Continue with norvasc - D/c HCTZ   Procedures: Abdominal ultrasound-Cholelithiasis.  No evidence acute cholecystitis or biliary obstruction.  Remainder of exam unremarkable.    Consultations:  None  Discharge Exam: Filed Vitals:   08/28/13 0558  BP: 110/72  Pulse: 79  Temp: 98 F (36.7 C)  Resp: 16    General: Appear in no acute distress Cardiovascular: s1s2 RRR Respiratory: Clear bilaterally  Discharge Instructions  Discharge Orders   Future Orders Complete By Expires   Diet - low sodium heart healthy  As directed    Increase activity slowly  As directed        Medication List    STOP taking these medications       hydrochlorothiazide 25 MG tablet  Commonly known as:  HYDRODIURIL      TAKE these medications       amLODipine 5 MG tablet  Commonly known as:  NORVASC  Take 5 mg by mouth daily.     promethazine 12.5 MG tablet  Commonly known as:  PHENERGAN  Take 1 tablet (12.5 mg total) by mouth every 6 (six) hours as needed for nausea  or vomiting.     traMADol 50 MG tablet  Commonly known as:  ULTRAM  Take 1 tablet (50 mg total) by mouth every 6 (six) hours as needed for severe pain.       Allergies  Allergen Reactions  . Flagyl [Metronidazole Hcl] Nausea And Vomiting  . Stadol [Butorphanol Tartrate] Other (See Comments)    Body feels hot, hallucinations        Follow-up Information   Follow up with Thayer HeadingsMACKENZIE,BRIAN, MD.   Specialty:  Internal Medicine   Contact information:   [address], [address] [place]  [id number] [phone]        The results of significant diagnostics from this hospitalization (including imaging, microbiology, ancillary and laboratory) are listed below for reference.    Significant Diagnostic Studies: [place] Abdomen Complete  08/27/2013   CLINICAL DATA:  Pancreatitis  EXAM: ULTRASOUND ABDOMEN COMPLETE  COMPARISON:  CT abdomen pelvis 08/23/2013  FINDINGS: Gallbladder:  Contracted nearly filled with shadowing calculi up to 16 mm diameter. No gallbladder wall thickening, pericholecystic fluid or sonographic Murphy sign.  Common bile duct:  Diameter: Normal caliber 4 mm diameter  Liver:  Normal appearance  IVC:  Normal appearance  Pancreas:  Normal appearance  Spleen:  Normal appearance, 6.8 cm length  Right Kidney:  Length: 12.0 cm.  Normal morphology without mass or hydronephrosis.  Left Kidney:  Length: 10.9 cm.  Normal morphology without mass or hydronephrosis.  Abdominal aorta:  Normal caliber  Other findings:  No free-fluid  IMPRESSION: Cholelithiasis.  No evidence acute cholecystitis or biliary obstruction.  Remainder of exam unremarkable.   Electronically Signed   By: Ulyses SouthwardMark  Boles M.D.   On: 08/27/2013 11:01   Ct Abdomen Pelvis W Contrast  08/23/2013   CLINICAL DATA:  Nausea and vomiting.  History of Crohn's disease  EXAM: CT ABDOMEN AND PELVIS WITH CONTRAST  TECHNIQUE: Multidetector CT imaging of the abdomen and pelvis was performed using the standard protocol following bolus administration of intravenous contrast.  CONTRAST:  80mL OMNIPAQUE IOHEXOL 300 MG/ML  SOLN  COMPARISON:  09/30/2011  FINDINGS: Lung bases are clear.  No pleural or pericardial effusion noted.  No suspicious liver abnormalities. The gallbladder appears normal. No biliary dilatation. Normal appearance of the pancreas. The spleen is unremarkable.  The adrenal glands are both normal. The right kidney is normal. The left kidney is normal. The urinary bladder appears within normal limits. The uterus appears normal. Cyst  noted within the left ovary. There is gas/debris identified within the dome of the vagina, which is of uncertain significance.  Normal caliber of the abdominal aorta without evidence for aneurysm. There is no upper abdominal adenopathy. No pelvic or inguinal adenopathy. There is no significant ascites within the abdomen or the pelvis.  The stomach appears normal. The small bowel loops have a normal course and caliber and there is no evidence for obstruction. The patient is status post colectomy and right lower quadrant ileostomy. No active inflammation identified at this time. No evidence for bowel perforation, or abscess formation.  Review of the visualized osseous structures is unremarkable. No worrisome lytic or sclerotic bone lesions identified.  IMPRESSION: 1. Previous colectomy with right lower quadrant ileostomy. 2. No evidence for active inflammation within the abdomen or pelvis. 3. Gas and debris is noted within the dome of the vagina which is of uncertain clinical significance.   Electronically Signed   By: Signa Kellaylor  Stroud M.D.   On: 08/23/2013 15:58    Microbiology: Recent Results (  from the past 240 hour(s))  GC/CHLAMYDIA PROBE AMP     Status: None   Collection Time    08/23/13  4:25 PM      Result Value Ref Range Status   CT Probe RNA NEGATIVE  NEGATIVE Final   GC Probe RNA NEGATIVE  NEGATIVE Final   Comment: (NOTE)                                                                                               **Normal Reference Range: Negative**          Assay performed using the Gen-Probe APTIMA COMBO2 (R) Assay.     Acceptable specimen types for this assay include APTIMA Swabs (Unisex,     endocervical, urethral, or vaginal), first void urine, and ThinPrep     liquid based cytology samples.     Performed at [facility]  WET PREP, GENITAL     Status: Abnormal   Collection Time    08/23/13  4:25 PM      Result Value Ref Range Status   Yeast Wet Prep HPF POC NONE SEEN  NONE  SEEN Final   Trich, Wet Prep NONE SEEN  NONE SEEN Final   Clue Cells Wet Prep HPF POC NONE SEEN  NONE SEEN Final   WBC, Wet Prep HPF POC MODERATE (*) NONE SEEN Final  URINE CULTURE     Status: None   Collection Time    08/24/13 12:39 AM      Result Value Ref Range Status   Specimen Description URINE, CLEAN CATCH   Final   Special Requests NONE   Final   Culture  Setup Time     Final   Value: 08/24/2013 17:48     Performed at [facility] Count     Final   Value: 45,000 COLONIES/ML     Performed at [facility]   Culture     Final   Value: LACTOBACILLUS SPECIES     Note: Standardized susceptibility testing for this organism is not available.     Performed at [facility]   Report Status 08/25/2013 FINAL   Final     Labs: Basic Metabolic Panel:  Recent Labs Lab 08/23/13 1412 08/24/13 0400 08/26/13 0615 08/26/13 1300 08/27/13 0615 08/28/13 0555  NA 136* 139 141  --  139 141  K 3.5* 3.7 3.0* 3.6* 3.2* 3.6*  CL 95* 102 102  --  98 102  CO2 21 25 30   --  29 27  GLUCOSE 107* 91 84  --  115* 90  BUN 49* 24* 3*  --  9 5*  CREATININE 1.71* 0.85 0.49*  --  0.55 0.50  CALCIUM 9.2 8.3* 8.2*  --  8.9 8.6   Liver Function Tests:  Recent Labs Lab 08/23/13 1412 08/24/13 0400  AST 18 14  ALT 22 17  ALKPHOS 56 47  BILITOT 0.4 0.3  PROT 8.2 7.1  ALBUMIN 4.1 3.4*    Recent Labs Lab 08/24/13 0400 08/26/13 0615 08/27/13 0615 08/28/13 0555  LIPASE 105* 132* 193* 148*  No results found for this basename: AMMONIA,  in the last 168 hours CBC:  Recent Labs Lab 08/23/13 1412 08/24/13 0400 08/26/13 0615  WBC 12.4* 11.0* 5.5  NEUTROABS 10.3*  --   --   HGB 15.3* 12.8 11.6*  HCT 41.6 35.7* 32.8*  MCV 86.5 87.3 87.2  PLT 305 351 279   Cardiac Enzymes: No results found for this basename: CKTOTAL, CKMB, CKMBINDEX, TROPONINI,  in the last 168 hours BNP: BNP (last 3 results) No results found for this basename: PROBNP,  in the last 8760  hours CBG: No results found for this basename: GLUCAP,  in the last 168 hours     Signed:  Sonnie Bias S  [facility] 08/28/2013, 2:57 PM

## 2015-05-19 IMAGING — CT CT ABD-PELV W/ CM
1 of 2 series · 15 of 32 positions shown, 19 images · IV contrast (omnipaque)
Comparison: 09/30/2011

CLINICAL DATA: Nausea and vomiting.  History of Crohn's disease

EXAM:
CT ABDOMEN AND PELVIS WITH CONTRAST
TECHNIQUE: Multidetector CT imaging of the abdomen and pelvis was performed
using the standard protocol following bolus administration of
intravenous contrast.
CONTRAST:  80mL OMNIPAQUE IOHEXOL 300 MG/ML  SOLN

[Series 2: abd/pel with · axial · 0.74mm/px · z∈[+506,+900]mm · 15 of 87 slices shown, 19 images]
[im 4/87  soft-tissue]
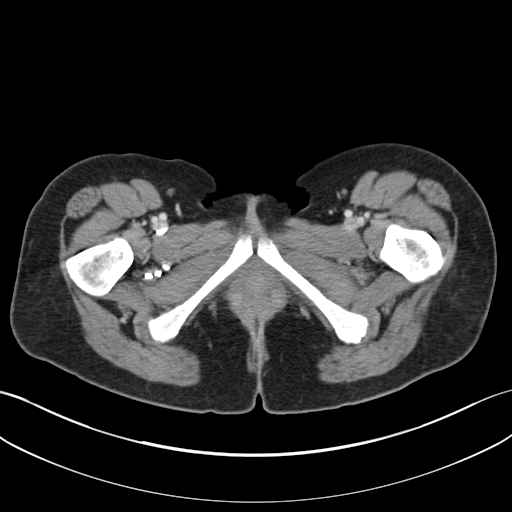
[im 4/87  bone]
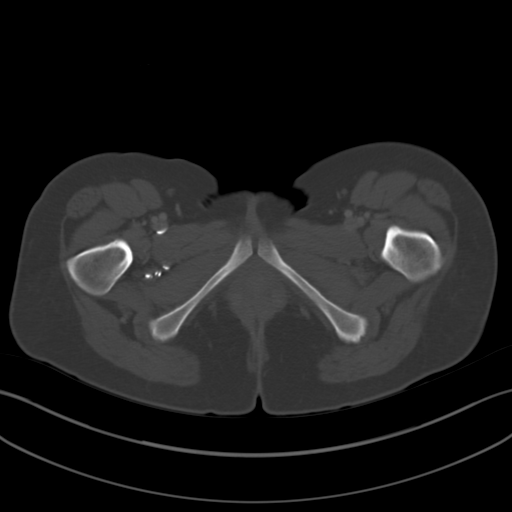
[im 11/87  soft-tissue]
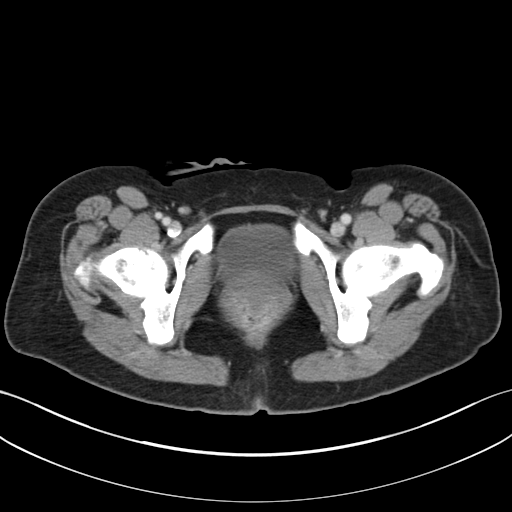
[im 18/87  soft-tissue]
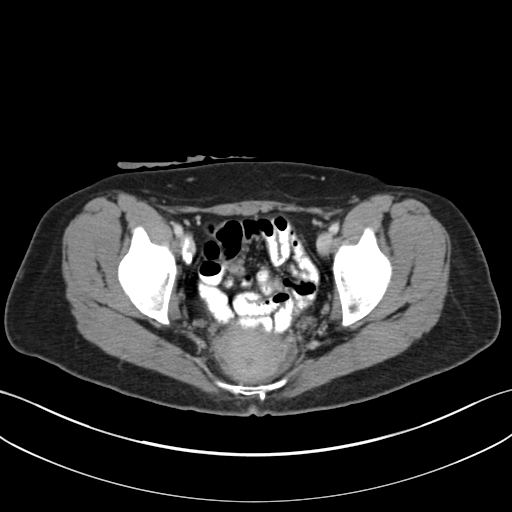
[im 25/87  soft-tissue]
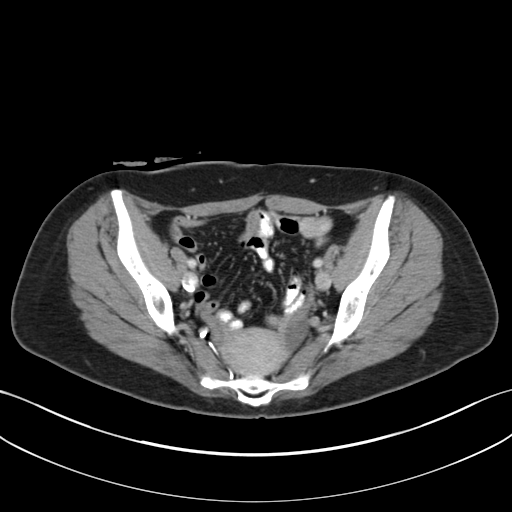
[im 31/87  soft-tissue]
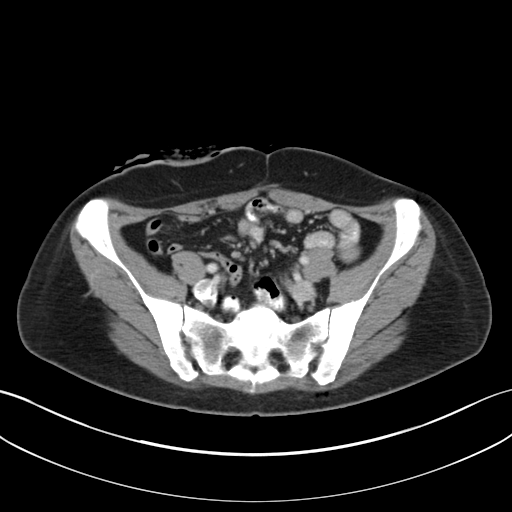
[im 38/87  soft-tissue]
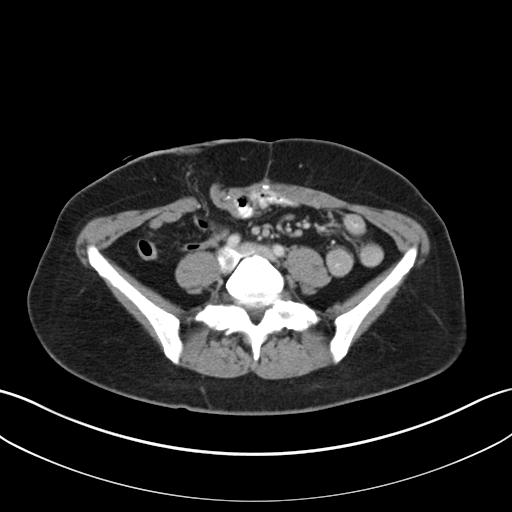
[im 45/87  soft-tissue]
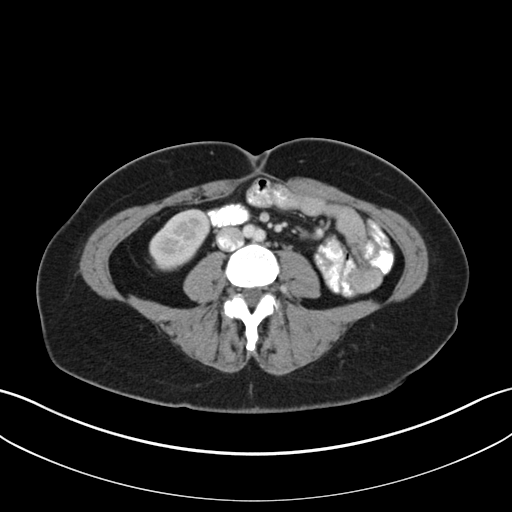
[im 49/87  soft-tissue]
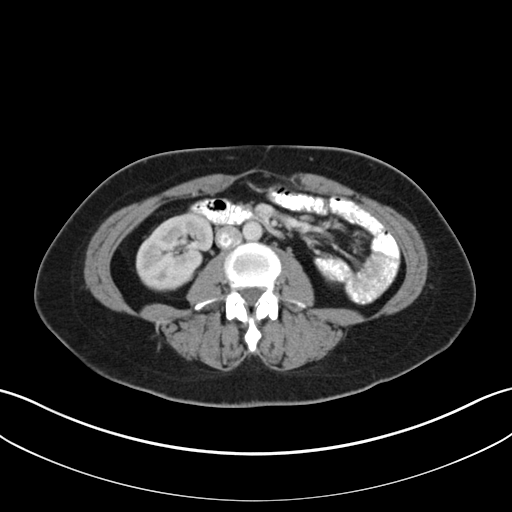
[im 56/87  soft-tissue]
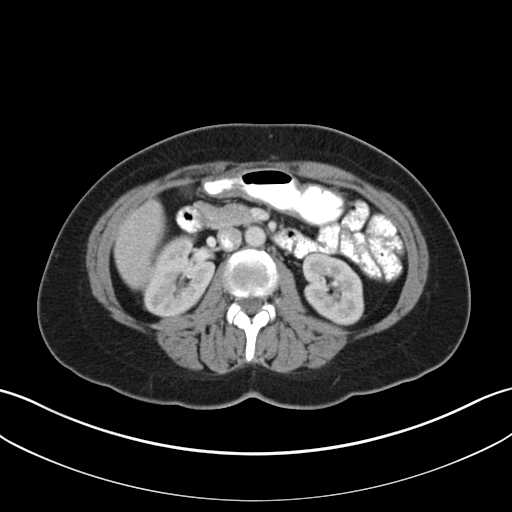
[im 56/87  bone]
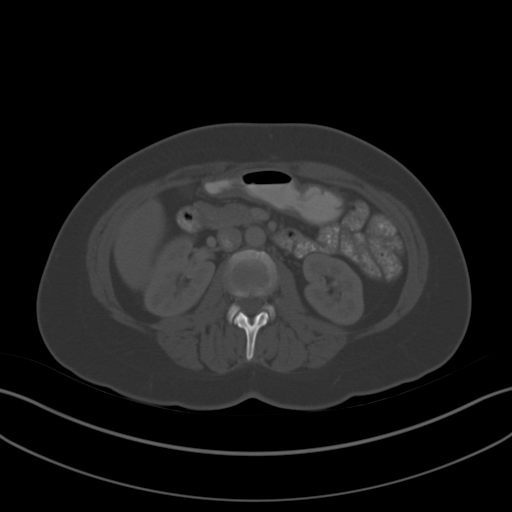
[im 62/87  soft-tissue]
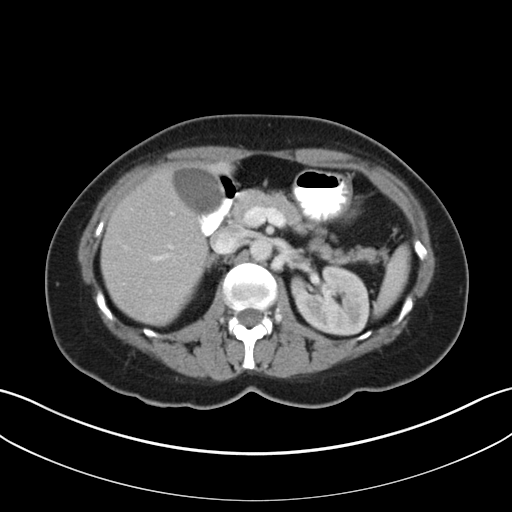
[im 69/87  soft-tissue]
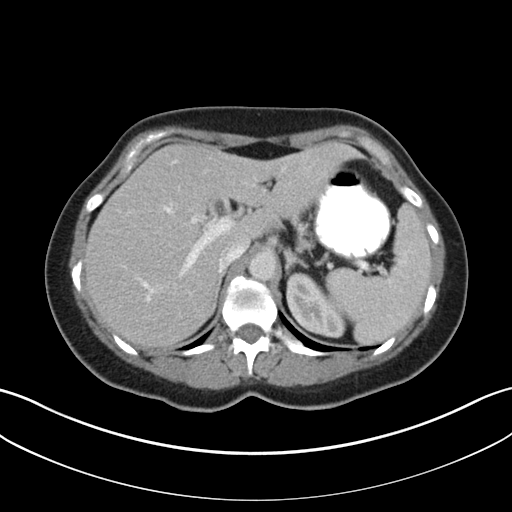
[im 73/87  lung]
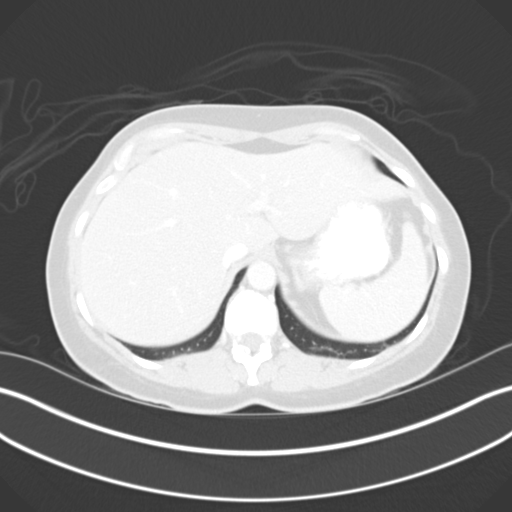
[im 76/87  soft-tissue]
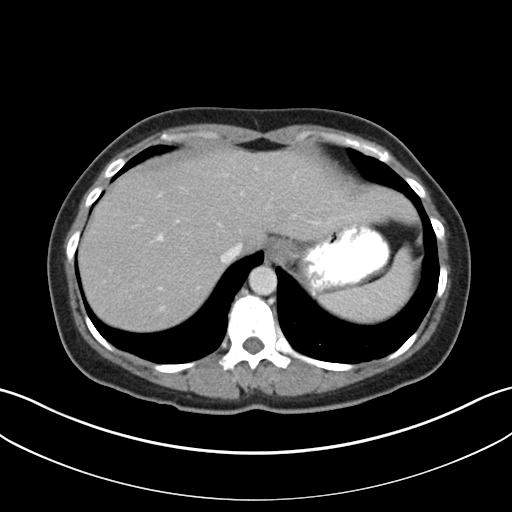
[im 76/87  lung]
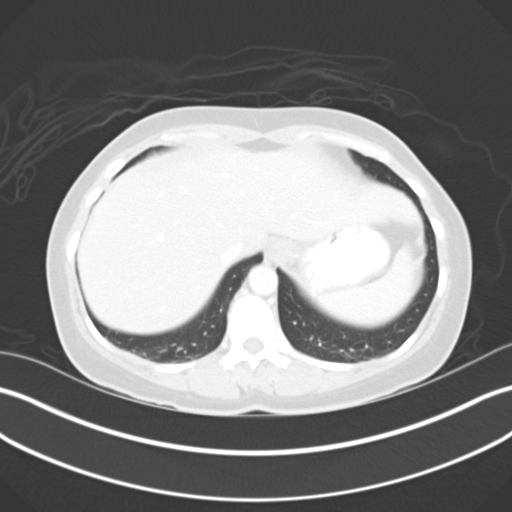
[im 80/87  lung]
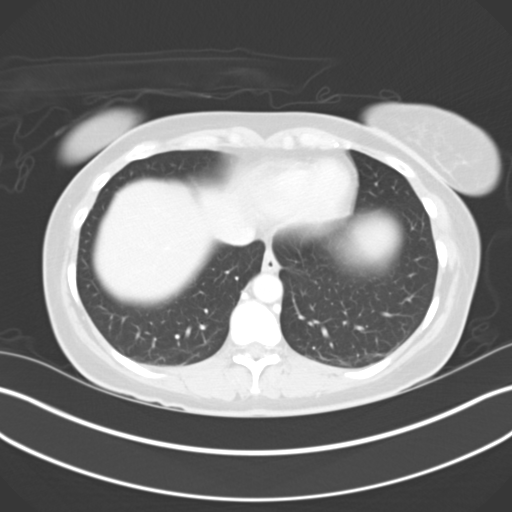
[im 83/87  soft-tissue]
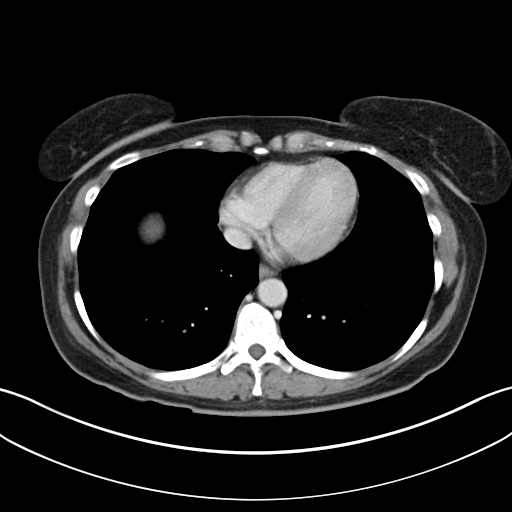
[im 83/87  lung]
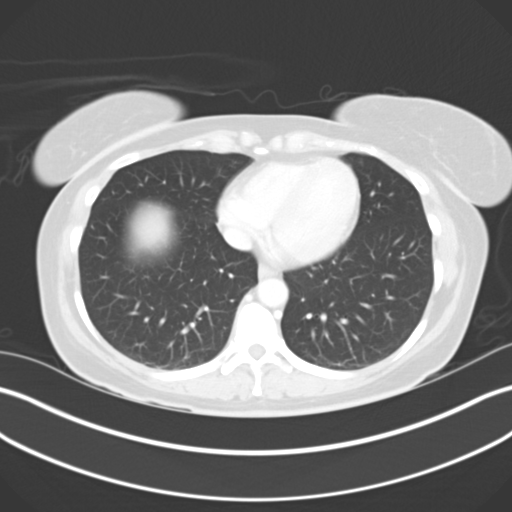

[15 of 32 positions shown; findings below may reference images not displayed]

FINDINGS: Lung bases are clear.  No pleural or pericardial effusion noted.

No suspicious liver abnormalities. The gallbladder appears normal.
No biliary dilatation. Normal appearance of the pancreas. The spleen
is unremarkable.

The adrenal glands are both normal. The right kidney is normal. The
left kidney is normal. The urinary bladder appears within normal
limits. The uterus appears normal. Cyst noted within the left ovary.
There is gas/debris identified within the dome of the vagina, which
is of uncertain significance.

Normal caliber of the abdominal aorta without evidence for aneurysm.
There is no upper abdominal adenopathy. No pelvic or inguinal
adenopathy. There is no significant ascites within the abdomen or
the pelvis.

The stomach appears normal. The small bowel loops have a normal
course and caliber and there is no evidence for obstruction. The
patient is status post colectomy and right lower quadrant ileostomy.
No active inflammation identified at this time. No evidence for
bowel perforation, or abscess formation.

Review of the visualized osseous structures is unremarkable. No
worrisome lytic or sclerotic bone lesions identified.
IMPRESSION: 1. Previous colectomy with right lower quadrant ileostomy.
2. No evidence for active inflammation within the abdomen or pelvis.
3. Gas and debris is noted within the dome of the vagina which is of
uncertain clinical significance.

## 2020-03-03 ENCOUNTER — Ambulatory Visit: Payer: Self-pay | Attending: Critical Care Medicine

## 2020-03-03 DIAGNOSIS — Z23 Encounter for immunization: Secondary | ICD-10-CM

## 2020-03-03 NOTE — Progress Notes (Signed)
   Covid-19 Vaccination Clinic  Name:  Stephanie [name]    MRN: [id number] DOB: [birth year]  03/03/2020  Stephanie [name] was observed post Covid-19 immunization for 15 minutes without incident. She was provided with Vaccine Information Sheet and instruction to access the V-Safe system.   Stephanie [name] was instructed to call 911 with any severe reactions post vaccine: Marland Kitchen Difficulty breathing  . Swelling of face and throat  . A fast heartbeat  . A bad rash all over body  . Dizziness and weakness

## 2021-01-22 ENCOUNTER — Encounter (HOSPITAL_BASED_OUTPATIENT_CLINIC_OR_DEPARTMENT_OTHER): Payer: Self-pay

## 2021-01-22 ENCOUNTER — Other Ambulatory Visit: Payer: Self-pay

## 2021-01-22 ENCOUNTER — Emergency Department (HOSPITAL_BASED_OUTPATIENT_CLINIC_OR_DEPARTMENT_OTHER)
Admission: EM | Admit: 2021-01-22 | Discharge: 2021-01-22 | Disposition: A | Discharge status: Home / Self Care | Payer: Medicare Other | Attending: Emergency Medicine | Admitting: Emergency Medicine

## 2021-01-22 ENCOUNTER — Emergency Department (HOSPITAL_BASED_OUTPATIENT_CLINIC_OR_DEPARTMENT_OTHER): Payer: Medicare Other

## 2021-01-22 DIAGNOSIS — R519 Headache, unspecified: Secondary | ICD-10-CM | POA: Diagnosis present

## 2021-01-22 DIAGNOSIS — E860 Dehydration: Secondary | ICD-10-CM | POA: Diagnosis not present

## 2021-01-22 DIAGNOSIS — D75839 Thrombocytosis, unspecified: Secondary | ICD-10-CM

## 2021-01-22 DIAGNOSIS — R5381 Other malaise: Secondary | ICD-10-CM | POA: Insufficient documentation

## 2021-01-22 DIAGNOSIS — M791 Myalgia, unspecified site: Secondary | ICD-10-CM | POA: Insufficient documentation

## 2021-01-22 DIAGNOSIS — Z87891 Personal history of nicotine dependence: Secondary | ICD-10-CM | POA: Insufficient documentation

## 2021-01-22 DIAGNOSIS — Z20822 Contact with and (suspected) exposure to covid-19: Secondary | ICD-10-CM | POA: Insufficient documentation

## 2021-01-22 LAB — CBC
HCT: 41.3 % (ref 36.0–46.0)
Hemoglobin: 14.4 g/dL (ref 12.0–15.0)
MCH: 29.2 pg (ref 26.0–34.0)
MCHC: 34.9 g/dL (ref 30.0–36.0)
MCV: 83.8 fL (ref 80.0–100.0)
Platelets: 808 10*3/uL — ABNORMAL HIGH (ref 150–400)
RBC: 4.93 MIL/uL (ref 3.87–5.11)
RDW: 12.5 % (ref 11.5–15.5)
WBC: 12.5 10*3/uL — ABNORMAL HIGH (ref 4.0–10.5)
nRBC: 0 % (ref 0.0–0.2)

## 2021-01-22 LAB — BASIC METABOLIC PANEL
Anion gap: 14 (ref 5–15)
BUN: 28 mg/dL — ABNORMAL HIGH (ref 6–20)
CO2: 32 mmol/L (ref 22–32)
Calcium: 10.4 mg/dL — ABNORMAL HIGH (ref 8.9–10.3)
Chloride: 91 mmol/L — ABNORMAL LOW (ref 98–111)
Creatinine, Ser: 1.09 mg/dL — ABNORMAL HIGH (ref 0.44–1.00)
GFR, Estimated: >60 mL/min (ref >60)
Glucose, Bld: 85 mg/dL (ref 70–99)
Potassium: 3.1 mmol/L — ABNORMAL LOW (ref 3.5–5.1)
Sodium: 137 mmol/L (ref 135–145)

## 2021-01-22 LAB — URINALYSIS, ROUTINE W REFLEX MICROSCOPIC
Bilirubin Urine: NEGATIVE
Glucose, UA: NEGATIVE mg/dL
Ketones, ur: NEGATIVE mg/dL
Nitrite: NEGATIVE
Protein, ur: >300 mg/dL — AB
Specific Gravity, Urine: 1.02 (ref 1.005–1.030)
pH: 6 (ref 5.0–8.0)

## 2021-01-22 LAB — RESP PANEL BY RT-PCR (FLU A&B, COVID) ARPGX2
Influenza A by PCR: NEGATIVE
Influenza B by PCR: NEGATIVE
SARS Coronavirus 2 by RT PCR: NEGATIVE

## 2021-01-22 MED ORDER — SODIUM CHLORIDE 0.9 % IV BOLUS (SEPSIS)
1000.0000 mL | Freq: Once | INTRAVENOUS | Status: AC
  Administered 2021-01-22: 1000 mL via INTRAVENOUS

## 2021-01-22 MED ORDER — ACETAMINOPHEN 325 MG PO TABS
650.0000 mg | ORAL_TABLET | Freq: Once | ORAL | Status: AC
  Administered 2021-01-22: 650 mg via ORAL
  Filled 2021-01-22: qty 2

## 2021-01-22 MED ORDER — KETOROLAC TROMETHAMINE 30 MG/ML IJ SOLN
15.0000 mg | Freq: Once | INTRAMUSCULAR | Status: AC
  Administered 2021-01-22: 15 mg via INTRAVENOUS
  Filled 2021-01-22: qty 1

## 2021-01-22 MED ORDER — SODIUM CHLORIDE 0.9 % IV SOLN
1000.0000 mL | INTRAVENOUS | Status: DC
  Administered 2021-01-22: 1000 mL via INTRAVENOUS

## 2021-01-22 MED ORDER — POTASSIUM CHLORIDE CRYS ER 20 MEQ PO TBCR
40.0000 meq | EXTENDED_RELEASE_TABLET | Freq: Once | ORAL | Status: AC
  Administered 2021-01-22: 40 meq via ORAL
  Filled 2021-01-22: qty 2

## 2021-01-22 MED ORDER — PROCHLORPERAZINE EDISYLATE 10 MG/2ML IJ SOLN
10.0000 mg | Freq: Once | INTRAMUSCULAR | Status: AC
  Administered 2021-01-22: 10 mg via INTRAVENOUS
  Filled 2021-01-22: qty 2

## 2021-01-22 NOTE — ED Triage Notes (Signed)
"  Last Wednesday I was tested for covid and it was negative, had a few skin tags, a rash on leg, headache and achy, fever. Was started on doxycycline in case of tick born illness, Monday I went in and had labs repeated and the doctor told me to go to the ED" per pt  I waited but today I got nauseated and got sweaty and confusion and don't feel right" per pt

## 2021-01-22 NOTE — ED Provider Notes (Signed)
[facility] EMERGENCY DEPT Provider Note   CSN: [id number] Arrival date & time: 01/22/21  1222     History Chief Complaint  Patient presents with   Follow-up    Follow up from lab work    Stephanie [name] is a 50 y.o. female.  HPI  Patient presented to the ED for evaluation of myalgias, malaise, and headache.  Patient does have a history of Crohn's disease.  She states however about 10 days ago she started having symptoms of myalgias, headaches, cough congestion and fatigue.  She went to her doctor's office and was tested for COVID.  Patient states that was negative but she continued to have symptoms.  Patient had developed a rash and was started on empiric treatment for [facility] spotted fever with doxycycline.  Patient also had laboratory tests and was told a few days ago that the labs were very abnormal and she should go to the emergency room.  Patient opted not to go because she started to feel a bit better but when she woke up this morning she was having a headache as well as myalgias and malaise.  Her coughing and congestion has improved.  She is not having any specific urinary symptoms.  She has not had any vomiting or diarrhea.  Patient states the rash resolved.  Past Medical History:  Diagnosis Date   Abdominal pain    Anxiety    Crohn's disease (HCC)    GERD (gastroesophageal reflux disease)    Migraine headache     Patient Active Problem List   Diagnosis Date Noted   Pancreatitis, acute 08/25/2013   Gastroenteritis 08/23/2013   Nausea & vomiting 08/23/2013   Acute renal failure (HCC) 08/23/2013   Leukocytosis, unspecified 08/23/2013   Opiate withdrawal (HCC) 12/08/2011   Migraines 09/12/2011   Anxiety 09/12/2011   Depression 09/12/2011   Rectovaginal fistula 09/12/2011   Crohn disease (HCC) 09/02/2011   Fever of unknown origin 09/02/2011    Past Surgical History:  Procedure Laterality Date   COLON SURGERY     removal of rectum, anus and  entire large colon   COLOSTOMY     VAGINA SURGERY     rectovaginal fistula repair     OB History   No obstetric history on file.     Family History  Problem Relation Age of Onset   Depression Maternal Aunt    Heart attack Paternal Uncle    Stroke Maternal Grandmother 3   Depression Maternal Grandmother    Nephrolithiasis Other    Other Mother        chrons disease   Cancer Paternal Grandfather        throat   Drug abuse Maternal Uncle    Drug abuse Cousin        maternal   Drug abuse Cousin        paternal    Social History   Tobacco Use   Smoking status: Former    Years: 10.00    Types: Cigarettes    Quit date: 07/04/2000    Years since quitting: 20.5   Smokeless tobacco: Never   Tobacco comments:    smoked 2-3 cigarettes/day  Substance Use Topics   Alcohol use: No   Drug use: No    Types: Fentanyl    Home Medications Prior to Admission medications   Medication Sig Start Date End Date Taking? Authorizing Provider  amLODipine (NORVASC) 5 MG tablet Take 5 mg by mouth daily.    [provider]  promethazine (PHENERGAN) 12.5 MG tablet Take 1 tablet (12.5 mg total) by mouth every 6 (six) hours as needed for nausea or vomiting. 08/28/13   Meredeth Ide, MD  traMADol (ULTRAM) 50 MG tablet Take 1 tablet (50 mg total) by mouth every 6 (six) hours as needed for severe pain. 08/28/13   Meredeth Ide, MD    Allergies    Flagyl [metronidazole hcl] and Stadol [butorphanol tartrate]  Review of Systems   Review of Systems  All other systems reviewed and are negative.  Physical Exam Updated Vital Signs BP 129/63   Pulse 70   Temp 98.6 F (37 C)   Resp 17   Ht 1.676 m (5\' 6" )   Wt 63.5 kg   SpO2 97%   BMI 22.60 kg/m   Physical Exam Vitals and nursing note reviewed.  Constitutional:      Appearance: She is well-developed. She is not toxic-appearing or diaphoretic.  HENT:     Head: Normocephalic and atraumatic.     Right Ear: External ear normal.      Left Ear: External ear normal.  Eyes:     General: No scleral icterus.       Right eye: No discharge.        Left eye: No discharge.     Conjunctiva/sclera: Conjunctivae normal.  Neck:     Trachea: No tracheal deviation.  Cardiovascular:     Rate and Rhythm: Normal rate and regular rhythm.  Pulmonary:     Effort: Pulmonary effort is normal. No respiratory distress.     Breath sounds: Normal breath sounds. No stridor. No wheezing or rales.  Abdominal:     General: Bowel sounds are normal. There is no distension.     Palpations: Abdomen is soft.     Tenderness: There is no abdominal tenderness. There is no guarding or rebound.  Musculoskeletal:        General: No tenderness or deformity.     Cervical back: Normal range of motion and neck supple. No rigidity.  Lymphadenopathy:     Cervical: No cervical adenopathy.  Skin:    General: Skin is warm and dry.     Coloration: Skin is not jaundiced.     Findings: No bruising or rash.  Neurological:     General: No focal deficit present.     Mental Status: She is alert and oriented to person, place, and time.     Cranial Nerves: No cranial nerve deficit (no facial droop, extraocular movements intact, no slurred speech).     Sensory: No sensory deficit.     Motor: No abnormal muscle tone or seizure activity.     Coordination: Coordination normal.  Psychiatric:        Mood and Affect: Mood normal.    ED Results / Procedures / Treatments   Labs (all labs ordered are listed, but only abnormal results are displayed) Labs Reviewed  BASIC METABOLIC PANEL - Abnormal; Notable for the following components:      Result Value   Potassium 3.1 (*)    Chloride 91 (*)    BUN 28 (*)    Creatinine, Ser 1.09 (*)    Calcium 10.4 (*)    All other components within normal limits  CBC - Abnormal; Notable for the following components:   WBC 12.5 (*)    Platelets 808 (*)    All other components within normal limits  URINALYSIS, ROUTINE W REFLEX  MICROSCOPIC - Abnormal; Notable for the  following components:   Hgb urine dipstick SMALL (*)    Protein, ur >300 (*)    Leukocytes,Ua SMALL (*)    All other components within normal limits  RESP PANEL BY RT-PCR (FLU A&B, COVID) [id number]    EKG EKG Interpretation  Date/Time:  Friday January 22 2021 12:35:40 EDT Ventricular Rate:  96 PR Interval:  172 QRS Duration: 70 QT Interval:  366 QTC Calculation: 462 R Axis:   73 Text Interpretation: Normal sinus rhythm Biatrial enlargement Abnormal ECG non specific st t changes since last tracing Confirmed by Linwood Dibbles [phone]) on 01/22/2021 5:43:40 PM  Radiology DG Chest Portable 1 View  Result Date: 01/22/2021 CLINICAL DATA:  Weakness. EXAM: PORTABLE CHEST 1 VIEW COMPARISON:  September 08, 2010. FINDINGS: The heart size and mediastinal contours are within normal limits. Both lungs are clear. The visualized skeletal structures are unremarkable. IMPRESSION: No active disease. Electronically Signed   By: Lupita Raider M.D.   On: 01/22/2021 16:32    Procedures Procedures   Medications Ordered in ED Medications  sodium chloride 0.9 % bolus 1,000 mL (1,000 mLs Intravenous New Bag/Given 01/22/21 1652)    Followed by  0.9 %  sodium chloride infusion (1,000 mLs Intravenous New Bag/Given 01/22/21 1644)  acetaminophen (TYLENOL) tablet 650 mg (650 mg Oral Given 01/22/21 1644)  potassium chloride SA (KLOR-CON) CR tablet 40 mEq (40 mEq Oral Given 01/22/21 1645)  prochlorperazine (COMPAZINE) injection 10 mg (10 mg Intravenous Given 01/22/21 1648)  ketorolac (TORADOL) 30 MG/ML injection 15 mg (15 mg Intravenous Given 01/22/21 1646)    ED Course  I have reviewed the triage vital signs and the nursing notes.  Pertinent labs & imaging results that were available during my care of the patient were reviewed by me and considered in my medical decision making (see chart for details).  Clinical Course as of 01/22/21 1749  Fri Jan 22, 2021  1626 Labs notable for  increased white blood cell count.  Mild hypokalemia and increased BUN and creatinine [JK]  1626 Thrombocytosis noted on CBC [JK]  1742 Urinalysis does not suggest infection. [JK]  1742  x-ray does not suggest pneumonia [JK]    Clinical Course User Index [JK] Linwood Dibbles, MD   MDM Rules/Calculators/A&P                           Patient presented to the ED for evaluation of persistent symptoms associated with headache, achiness.  Patient was empirically started on doxycycline.  Patient was also told her laboratory tests were abnormal recently.  Patient's exam is reassuring.  She is not hypoxic.  She is not tachycardic.  Her neck is supple no signs of meningismus to suggest meningitis.  Chest x-ray does not show any pneumonia.  Patient's urinalysis does not suggest UTI.  COVID and flu are pending the patient is outside of any treatment window.  She is anxious to go home at this point.  She is feeling somewhat better after IV fluids.  Recommend outpatient follow-up with her primary care doctor to have her labs rechecked.  [place] spotted fever is a possibility based on her symptoms but she is already on appropriate treatment.  She does not have any indications to suggest admission at this time. Final Clinical Impression(s) / ED Diagnoses Final diagnoses:  Thrombocytosis  Dehydration    Rx / DC Orders ED Discharge Orders     None        Niasha Devins,  Cletis Athens, MD 01/22/21 1750

## 2021-01-22 NOTE — ED Notes (Signed)
Pt stated that she wished to leave. MD notified and he stated that he was going to look at her lab results and consult with her. Pt stated that she would wait on the provider

## 2021-01-22 NOTE — Discharge Instructions (Addendum)
Drink plenty of fluids.  Your platelet count was elevated today.  This does not require any specific treatment and there can be many causes for this but it is worth following up with your doctor to have that rechecked.  Your COVID and flu test are pending.  They should be available later this evening.  You can check the results in MyChart.  Your doctor will also have access to those records.

## 2022-10-07 ENCOUNTER — Encounter (HOSPITAL_COMMUNITY): Payer: Self-pay

## 2022-10-07 ENCOUNTER — Emergency Department (HOSPITAL_COMMUNITY)
Admission: EM | Admit: 2022-10-07 | Discharge: 2022-10-08 | Disposition: A | Discharge status: Home / Self Care | Payer: Medicare Other | Attending: Emergency Medicine | Admitting: Emergency Medicine

## 2022-10-07 DIAGNOSIS — Z933 Colostomy status: Secondary | ICD-10-CM | POA: Insufficient documentation

## 2022-10-07 DIAGNOSIS — L249 Irritant contact dermatitis, unspecified cause: Secondary | ICD-10-CM | POA: Diagnosis present

## 2022-10-07 DIAGNOSIS — Z87891 Personal history of nicotine dependence: Secondary | ICD-10-CM | POA: Insufficient documentation

## 2022-10-07 DIAGNOSIS — F1190 Opioid use, unspecified, uncomplicated: Secondary | ICD-10-CM | POA: Diagnosis not present

## 2022-10-07 DIAGNOSIS — R7401 Elevation of levels of liver transaminase levels: Secondary | ICD-10-CM | POA: Diagnosis not present

## 2022-10-07 DIAGNOSIS — Z939 Artificial opening status, unspecified: Secondary | ICD-10-CM

## 2022-10-07 DIAGNOSIS — Z79899 Other long term (current) drug therapy: Secondary | ICD-10-CM | POA: Diagnosis not present

## 2022-10-07 DIAGNOSIS — K5090 Crohn's disease, unspecified, without complications: Secondary | ICD-10-CM | POA: Diagnosis not present

## 2022-10-07 DIAGNOSIS — R238 Other skin changes: Secondary | ICD-10-CM

## 2022-10-07 MED ORDER — HYDROMORPHONE HCL 1 MG/ML IJ SOLN
1.0000 mg | Freq: Once | INTRAMUSCULAR | Status: AC
  Administered 2022-10-08: 1 mg via INTRAVENOUS
  Filled 2022-10-07: qty 1

## 2022-10-07 MED ORDER — ONDANSETRON HCL 4 MG/2ML IJ SOLN
4.0000 mg | Freq: Once | INTRAMUSCULAR | Status: AC
  Administered 2022-10-08: 4 mg via INTRAVENOUS
  Filled 2022-10-07: qty 2

## 2022-10-07 MED ORDER — SODIUM CHLORIDE 0.9 % IV BOLUS
1000.0000 mL | Freq: Once | INTRAVENOUS | Status: AC
  Administered 2022-10-08: 1000 mL via INTRAVENOUS

## 2022-10-07 NOTE — ED Provider Notes (Signed)
[place] EMERGENCY DEPARTMENT AT [facility] Provider Note  CSN: [id number] Arrival date & time: 10/07/22 2233  Chief Complaint(s) No chief complaint on file.  HPI Stephanie [name] is a 52 y.o. female with past medical history as below, significant for opiate use, rectovaginal fistula, Crohn's disease, pancreatitis who presents to the ED with complaint of abd pain, abnormality of her ostomy.  Patient reports irritation around her ostomy site associated with poor fitting ostomy bag.  She recently started using a new ostomy bag after seeing her ostomy nurse a few days ago which has been more effective and creates a better seal.  However there is ongoing irritation around her ostomy site that makes it uncomfortable to apply her ostomy bag.  No fevers, chills, nausea or vomiting.  Reduced p.o. intake today because she was trying to leave her ostomy bag off and wanted to reduce the irritation to her ostomy site.  Follows with Bloomfeld, Quitman Livings, MD  GI  Past Medical History Past Medical History:  Diagnosis Date   Abdominal pain    Anxiety    Crohn's disease    GERD (gastroesophageal reflux disease)    Migraine headache    Patient Active Problem List   Diagnosis Date Noted   Pancreatitis, acute 08/25/2013   Gastroenteritis 08/23/2013   Nausea & vomiting 08/23/2013   Acute renal failure 08/23/2013   Leukocytosis, unspecified 08/23/2013   Opiate withdrawal 12/08/2011   Migraines 09/12/2011   Anxiety 09/12/2011   Depression 09/12/2011   Rectovaginal fistula 09/12/2011   Crohn disease 09/02/2011   Fever of unknown origin 09/02/2011   Home Medication(s) Prior to Admission medications   Medication Sig Start Date End Date Taking? Authorizing Provider  amLODipine (NORVASC) 5 MG tablet Take 5 mg by mouth daily.    [provider]  promethazine (PHENERGAN) 12.5 MG tablet Take 1 tablet (12.5 mg total) by mouth every 6 (six) hours as needed for nausea or vomiting. 08/28/13    Meredeth Ide, MD  traMADol (ULTRAM) 50 MG tablet Take 1 tablet (50 mg total) by mouth every 6 (six) hours as needed for severe pain. 08/28/13   Meredeth Ide, MD                                                                                                                                    Past Surgical History Past Surgical History:  Procedure Laterality Date   COLON SURGERY     removal of rectum, anus and entire large colon   COLOSTOMY     VAGINA SURGERY     rectovaginal fistula repair   Family History Family History  Problem Relation Age of Onset   Depression Maternal Aunt    Heart attack Paternal Uncle    Stroke Maternal Grandmother 99   Depression Maternal Grandmother    Nephrolithiasis Other    Other Mother  chrons disease   Cancer Paternal Grandfather        throat   Drug abuse Maternal Uncle    Drug abuse Cousin        maternal   Drug abuse Cousin        paternal    Social History Social History   Tobacco Use   Smoking status: Former    Years: 10    Types: Cigarettes    Quit date: 07/04/2000    Years since quitting: 22.2   Smokeless tobacco: Never   Tobacco comments:    smoked 2-3 cigarettes/day  Substance Use Topics   Alcohol use: No   Drug use: No    Types: Fentanyl   Allergies Flagyl [metronidazole hcl] and Stadol [butorphanol tartrate]  Review of Systems Review of Systems  Constitutional:  Negative for activity change and fever.  HENT:  Negative for facial swelling and trouble swallowing.   Eyes:  Negative for discharge and redness.  Respiratory:  Negative for cough and shortness of breath.   Cardiovascular:  Negative for chest pain and palpitations.  Gastrointestinal:  Negative for abdominal pain and nausea.  Genitourinary:  Negative for dysuria and flank pain.  Musculoskeletal:  Negative for back pain and gait problem.  Skin:  Positive for rash. Negative for pallor.  Neurological:  Negative for syncope and headaches.    Physical  Exam Vital Signs  I have reviewed the triage vital signs BP (!) 132/94 (BP Location: Right Arm)   Pulse 85   Temp 98.4 F (36.9 C) (Oral)   Resp 17   Ht  (1.651 m)   Wt 68 kg   SpO2 93%   BMI 24.96 kg/m  Physical Exam Vitals and nursing note reviewed.  Constitutional:      General: She is not in acute distress.    Appearance: Normal appearance.  HENT:     Head: Normocephalic and atraumatic.     Right Ear: External ear normal.     Left Ear: External ear normal.     Nose: Nose normal.     Mouth/Throat:     Mouth: Mucous membranes are moist.  Eyes:     General: No scleral icterus.       Right eye: No discharge.        Left eye: No discharge.  Cardiovascular:     Rate and Rhythm: Normal rate and regular rhythm.     Pulses: Normal pulses.     Heart sounds: Normal heart sounds.  Pulmonary:     Effort: Pulmonary effort is normal. No respiratory distress.     Breath sounds: Normal breath sounds.  Abdominal:     General: Abdomen is flat.     Palpations: Abdomen is soft.     Tenderness: There is no abdominal tenderness.    Musculoskeletal:     Right lower leg: No edema.     Left lower leg: No edema.  Skin:    General: Skin is warm and dry.     Capillary Refill: Capillary refill takes less than 2 seconds.       Neurological:     Mental Status: She is alert and oriented to person, place, and time.     GCS: GCS eye subscore is 4. GCS verbal subscore is 5. GCS motor subscore is 6.  Psychiatric:        Mood and Affect: Mood normal.        Behavior: Behavior normal.     ED Results and  Treatments Labs (all labs ordered are listed, but only abnormal results are displayed) Labs Reviewed - No data to display                                                                                                                        Radiology No results found.  Pertinent labs & imaging results that were available during my care of the patient were reviewed by me and  considered in my medical decision making (see MDM for details).  Medications Ordered in ED Medications - No data to display                                                                                                                                   Procedures Procedures  (including critical care time)  Medical Decision Making / ED Course    Medical Decision Making:    KORAIMA BUMFORD is a 52 y.o. female with past medical history as below, significant for opiate use, rectovaginal fistula, Crohn's disease, pancreatitis who presents to the ED with complaint of abd pain, abnormality of her ostomy.  . The complaint involves an extensive differential diagnosis and also carries with it a high risk of complications and morbidity.  Serious etiology was considered. Ddx includes but is not limited to: Cellulitis, fungal infection, superficial irritation, irritation dermatitis, soft tissue injury, ostomy complication, daughter  Complete initial physical exam performed, notably the patient  was no acute distress, afebrile Reviewed and confirmed nursing documentation for past medical history, family history, social history.  Vital signs reviewed.      Patient with superficial irritation around her ostomy site, likely secondary to leakage from her ostomy.  Query cellulitis versus possible Candida.  Will collect screening labs, analgesia, IV fluids    Additional history obtained: -Additional history obtained from family -External records from outside source obtained and reviewed including: Chart review including previous notes, labs, imaging, consultation notes including primary care documentation, gastroenterology documentation, home medications, prior labs and imaging Recent GI office note with Dr. Chesley Mires 3/25 > performed Endo ileoscopy >> "Mild chronic active ileitis with minimal acute inflammation within crypt epithelium, increased chronic inflammation in the lamina propria, and some  architectural distortion. No granulomas, dysplasia, or malignancy identified. "  "Findings The scope was passed through the ileostomy to about 60 cm in the ileum. There was one small ulcer at about 40 cm, a few cold biopsies  were obtained. The remainder of the ileum to the stoma was normal. "        Lab Tests: -I ordered, reviewed, and interpreted labs.   The pertinent results include:   Labs Reviewed - No data to display  Notable for ***  EKG   EKG Interpretation  Date/Time:    Ventricular Rate:    PR Interval:    QRS Duration:   QT Interval:    QTC Calculation:   R Axis:     Text Interpretation:           Imaging Studies ordered: I ordered imaging studies including *** I independently visualized the following imaging with scope of interpretation limited to determining acute life threatening conditions related to emergency care; findings noted above, significant for *** I independently visualized and interpreted imaging. I agree with the radiologist interpretation   Medicines ordered and prescription drug management: No orders of the defined types were placed in this encounter.   -I have reviewed the patients home medicines and have made adjustments as needed   Consultations Obtained: I requested consultation with the ***,  and discussed lab and imaging findings as well as pertinent plan - they recommend: ***   Cardiac Monitoring: The patient was maintained on a cardiac monitor.  I personally viewed and interpreted the cardiac monitored which showed an underlying rhythm of: ***  Social Determinants of Health:  Diagnosis or treatment significantly limited by social determinants of health: {wssoc:28071}   Reevaluation: After the interventions noted above, I reevaluated the patient and found that they have {resolved/improved/worsened:23923::"improved"}  Co morbidities that complicate the patient evaluation  Past Medical History:  Diagnosis Date    Abdominal pain    Anxiety    Crohn's disease    GERD (gastroesophageal reflux disease)    Migraine headache       Dispostion: Disposition decision including need for hospitalization was considered, and patient {wsdispo:28070::"discharged from emergency department."}    Final Clinical Impression(s) / ED Diagnoses Final diagnoses:  None     This chart was dictated using voice recognition software.  Despite best efforts to proofread,  errors can occur which can change the documentation meaning.

## 2022-10-07 NOTE — ED Triage Notes (Signed)
Pt arrived via POV, c/o pain and irritation to ostomy site. States that she has had ongoing issues but not to this degree.

## 2022-10-08 LAB — CBC WITH DIFFERENTIAL/PLATELET
Abs Immature Granulocytes: 0.02 10*3/uL (ref 0.00–0.07)
Basophils Absolute: 0.1 10*3/uL (ref 0.0–0.1)
Basophils Relative: 1 %
Eosinophils Absolute: 0.1 10*3/uL (ref 0.0–0.5)
Eosinophils Relative: 1 %
HCT: 41.7 % (ref 36.0–46.0)
Hemoglobin: 14.1 g/dL (ref 12.0–15.0)
Immature Granulocytes: 0 %
Lymphocytes Relative: 18 %
Lymphs Abs: 2 10*3/uL (ref 0.7–4.0)
MCH: 28.9 pg (ref 26.0–34.0)
MCHC: 33.8 g/dL (ref 30.0–36.0)
MCV: 85.5 fL (ref 80.0–100.0)
Monocytes Absolute: 0.7 10*3/uL (ref 0.1–1.0)
Monocytes Relative: 7 %
Neutro Abs: 8.2 10*3/uL — ABNORMAL HIGH (ref 1.7–7.7)
Neutrophils Relative %: 73 %
Platelets: 545 10*3/uL — ABNORMAL HIGH (ref 150–400)
RBC: 4.88 MIL/uL (ref 3.87–5.11)
RDW: 13.3 % (ref 11.5–15.5)
WBC: 11.1 10*3/uL — ABNORMAL HIGH (ref 4.0–10.5)
nRBC: 0 % (ref 0.0–0.2)

## 2022-10-08 LAB — COMPREHENSIVE METABOLIC PANEL
ALT: 65 U/L — ABNORMAL HIGH (ref 0–44)
AST: 62 U/L — ABNORMAL HIGH (ref 15–41)
Albumin: 4.6 g/dL (ref 3.5–5.0)
Alkaline Phosphatase: 137 U/L — ABNORMAL HIGH (ref 38–126)
Anion gap: 13 (ref 5–15)
BUN: 25 mg/dL — ABNORMAL HIGH (ref 6–20)
CO2: 24 mmol/L (ref 22–32)
Calcium: 9.6 mg/dL (ref 8.9–10.3)
Chloride: 97 mmol/L — ABNORMAL LOW (ref 98–111)
Creatinine, Ser: 1.14 mg/dL — ABNORMAL HIGH (ref 0.44–1.00)
GFR, Estimated: 59 mL/min — ABNORMAL LOW (ref >60)
Glucose, Bld: 105 mg/dL — ABNORMAL HIGH (ref 70–99)
Potassium: 3.6 mmol/L (ref 3.5–5.1)
Sodium: 134 mmol/L — ABNORMAL LOW (ref 135–145)
Total Bilirubin: 0.8 mg/dL (ref 0.3–1.2)
Total Protein: 8.7 g/dL — ABNORMAL HIGH (ref 6.5–8.1)

## 2022-10-08 LAB — LIPASE, BLOOD: Lipase: 191 U/L — ABNORMAL HIGH (ref 11–51)

## 2022-10-08 MED ORDER — SODIUM CHLORIDE 0.9 % IV SOLN
1.0000 g | Freq: Once | INTRAVENOUS | Status: AC
  Administered 2022-10-08: 1 g via INTRAVENOUS
  Filled 2022-10-08: qty 10

## 2022-10-08 MED ORDER — SODIUM CHLORIDE 0.9 % IV BOLUS
1000.0000 mL | Freq: Once | INTRAVENOUS | Status: AC
  Administered 2022-10-08: 1000 mL via INTRAVENOUS

## 2022-10-08 MED ORDER — ACETAMINOPHEN 325 MG PO TABS: 650.0000 mg | ORAL_TABLET | Freq: Four times a day (QID) | ORAL | 0 refills | Status: AC | PRN

## 2022-10-08 MED ORDER — CEPHALEXIN 500 MG PO CAPS: 500.0000 mg | ORAL_CAPSULE | Freq: Three times a day (TID) | ORAL | 0 refills | Status: AC

## 2022-10-08 MED ORDER — IBUPROFEN 600 MG PO TABS: 600.0000 mg | ORAL_TABLET | Freq: Four times a day (QID) | ORAL | 0 refills | Status: AC | PRN

## 2022-10-08 MED ORDER — HYDROMORPHONE HCL 1 MG/ML IJ SOLN
1.0000 mg | Freq: Once | INTRAMUSCULAR | Status: AC
  Administered 2022-10-08: 1 mg via INTRAVENOUS
  Filled 2022-10-08: qty 1

## 2022-10-08 MED ORDER — OXYCODONE HCL 5 MG PO TABS: 5.0000 mg | ORAL_TABLET | ORAL | 0 refills | Status: AC | PRN

## 2022-10-08 MED ORDER — LIDOCAINE HCL 4 % EX SOLN
Freq: Once | CUTANEOUS | Status: AC
  Filled 2022-10-08: qty 50

## 2022-10-08 MED ORDER — OXYCODONE-ACETAMINOPHEN 5-325 MG PO TABS
1.0000 | ORAL_TABLET | Freq: Once | ORAL | Status: AC
  Administered 2022-10-08: 1 via ORAL
  Filled 2022-10-08: qty 1

## 2022-10-08 MED ORDER — KETOROLAC TROMETHAMINE 15 MG/ML IJ SOLN
15.0000 mg | Freq: Once | INTRAMUSCULAR | Status: AC
  Administered 2022-10-08: 15 mg via INTRAVENOUS
  Filled 2022-10-08: qty 1

## 2022-10-08 MED ORDER — LIDOCAINE-EPINEPHRINE-TETRACAINE (LET) TOPICAL GEL: 6.0000 mL | Freq: Once | TOPICAL | Status: DC

## 2022-10-08 MED ORDER — OXYCODONE HCL 5 MG PO TABS: 5.0000 mg | ORAL_TABLET | ORAL | 0 refills | Status: DC | PRN

## 2022-10-08 NOTE — Discharge Instructions (Signed)
Please follow-up with ostomy clinic at your earliest convenience  It was a pleasure caring for you today in the emergency department.  Please return to the emergency department for any worsening or worrisome symptoms.

## 2022-10-18 IMAGING — DX DG CHEST 1V PORT
1 series · 1 of 1 positions shown · non-contrast
Comparison: September 08, 2010.

CLINICAL DATA: Weakness.

EXAM:
PORTABLE CHEST 1 VIEW

[chest]
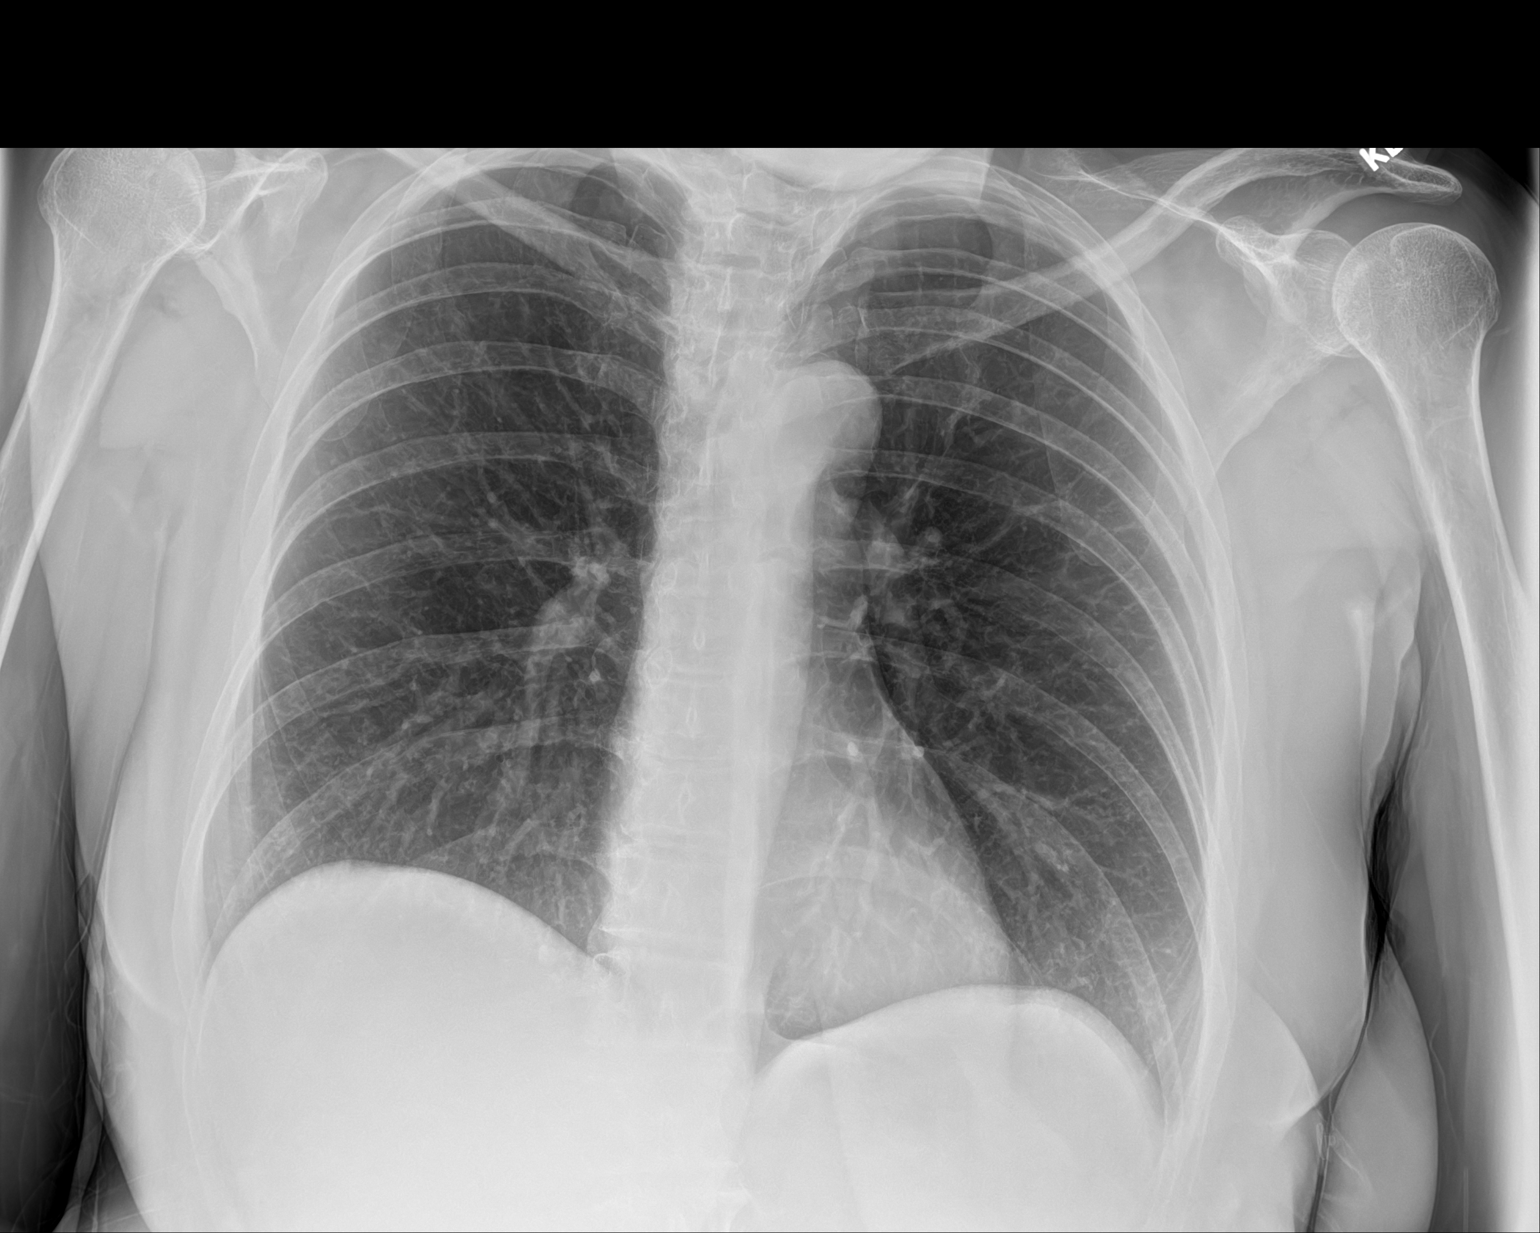

[1 of 1 positions shown; findings below may reference images not displayed]

FINDINGS: The heart size and mediastinal contours are within normal limits.
Both lungs are clear. The visualized skeletal structures are
unremarkable.
IMPRESSION: No active disease.

## 2023-10-24 ENCOUNTER — Telehealth: Payer: Self-pay | Admitting: Gastroenterology

## 2023-10-24 NOTE — Telephone Encounter (Signed)
 Patient called and stated that she is wanting to schedule an appointment with Dr. Bridgett Camps. Patient was referred by her mother who is a patient of Dr. Bridgett Camps. Patient stated that she has had various procedure done over the years. And is not receiving the care she deserves with [facility]. Patient stated that she will call them and have them fax our her procedure report along with her pathology report. Please advise.
